# Patient Record
Sex: Female | Born: 1995
Health system: Southern US, Community
[De-identification: ages and names within clinical notes are randomized; demographics above are authoritative.]

## PROBLEM LIST (undated history)

## (undated) DIAGNOSIS — Z8614 Personal history of Methicillin resistant Staphylococcus aureus infection: Secondary | ICD-10-CM

---
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
## 2015-02-21 ENCOUNTER — Other Ambulatory Visit (HOSPITAL_COMMUNITY)
Admission: RE | Admit: 2015-02-21 | Discharge: 2015-02-21 | Disposition: A | Discharge status: Home / Self Care | Payer: BC Managed Care – PPO | Source: Ambulatory Visit | Attending: Emergency Medicine | Admitting: Emergency Medicine

## 2015-02-21 ENCOUNTER — Encounter (HOSPITAL_COMMUNITY): Payer: Self-pay | Admitting: Emergency Medicine

## 2015-02-21 ENCOUNTER — Emergency Department (HOSPITAL_COMMUNITY)
Admission: EM | Admit: 2015-02-21 | Discharge: 2015-02-21 | Disposition: A | Discharge status: Home / Self Care | Payer: BC Managed Care – PPO | Source: Home / Self Care | Attending: Emergency Medicine | Admitting: Emergency Medicine

## 2015-02-21 DIAGNOSIS — Z113 Encounter for screening for infections with a predominantly sexual mode of transmission: Secondary | ICD-10-CM

## 2015-02-21 DIAGNOSIS — N760 Acute vaginitis: Secondary | ICD-10-CM

## 2015-02-21 LAB — POCT URINALYSIS DIP (DEVICE)
Bilirubin Urine: NEGATIVE
GLUCOSE, UA: NEGATIVE mg/dL
HGB URINE DIPSTICK: NEGATIVE
KETONES UR: NEGATIVE mg/dL
Leukocytes, UA: NEGATIVE
Nitrite: NEGATIVE
PROTEIN: NEGATIVE mg/dL
SPECIFIC GRAVITY, URINE: 1.025 (ref 1.005–1.030)
Urobilinogen, UA: 0.2 mg/dL (ref 0.0–1.0)
pH: 6 (ref 5.0–8.0)

## 2015-02-21 LAB — POCT PREGNANCY, URINE: Preg Test, Ur: NEGATIVE

## 2015-02-21 MED ORDER — METRONIDAZOLE 500 MG PO TABS: 500.0000 mg | ORAL_TABLET | Freq: Two times a day (BID) | ORAL | Status: DC

## 2015-02-21 MED ORDER — FLUCONAZOLE 150 MG PO TABS: 150.0000 mg | ORAL_TABLET | Freq: Once | ORAL | Status: DC

## 2015-02-21 NOTE — ED Provider Notes (Signed)
HPI  SUBJECTIVE:  Morgan Cruz is a 20 y.o. female who presents with  7 days  nonoderous vaginal discharge,  vaginal itching. No urgency, frequency, dysuria, oderous urine, hematuria,  genital blisters No aggravating, alleviating factors. Has not tried anything for this. No fevers, N/V, abd pain, back pain. No recent abx use. Pt sexually active with same female partner who is asxatic.  STD's a slight concern today, and is requesting be tested for everything. Has a history of yeast infections, chlamydia. No history of BV gonorrhea, Trichomonas, PID, ectopic pregnancy. No h/o syphilis, herpes, HIV. No h/o DM. LMP last week. PMD Dr. Julian Reil, OB[facility]N at [facility].   History reviewed. No pertinent past medical history.  History reviewed. No pertinent past surgical history.  History reviewed. No pertinent family history.  Social History  Substance Use Topics  . Smoking status: Never Smoker   . Smokeless tobacco: None  . Alcohol Use: No    No current facility-administered medications for this encounter.  Current outpatient prescriptions:  .  fluconazole (DIFLUCAN) 150 MG tablet, Take 1 tablet (150 mg total) by mouth once. 1 tab po x 1. May repeat in 72 hours if no improvement, Disp: 2 tablet, Rfl: 0 .  metroNIDAZOLE (FLAGYL) 500 MG tablet, Take 1 tablet (500 mg total) by mouth 2 (two) times daily. X 7 days, Disp: 14 tablet, Rfl: 0  No Known Allergies   ROS  As noted in HPI.   Physical Exam  BP 132/84 mmHg  Pulse 65  Temp(Src) 97.8 F (36.6 C) (Oral)  Resp 12  SpO2 100%  LMP 02/14/2015 (Exact Date)  Constitutional: Well developed, well nourished, no acute distress Eyes:  EOMI, conjunctiva normal bilaterally HENT: Normocephalic, atraumatic,mucus membranes moist Respiratory: Normal inspiratory effort Cardiovascular: Normal rate GI: nondistended soft, nontender. No suprapubic tenderness  back: No CVA tenderness GU: External genitalia normal.  Normal vaginal mucosa.  Normal os.  Thin  nonoderous  white vaginal discharge.  Uterus smooth, NT. No CMT. No adnexal tenderness. No adnexal masses.  Chaperone present during exam skin: No rash, skin intact Musculoskeletal: no deformities Neurologic: Alert & oriented x 3, no focal neuro deficits Psychiatric: Speech and behavior appropriate   ED Course   Medications - No data to display  Orders Placed This Encounter  Procedures  . HIV antibody    Standing Status: Standing     Number of Occurrences: 1     Standing Expiration Date:   . RPR    Standing Status: Standing     Number of Occurrences: 1     Standing Expiration Date:   . POCT urinalysis dip (device)    Standing Status: Standing     Number of Occurrences: 1     Standing Expiration Date:   . Pregnancy, urine POC    Standing Status: Standing     Number of Occurrences: 1     Standing Expiration Date:     Results for orders placed or performed during the hospital encounter of 02/21/15 (from the past 24 hour(s))  POCT urinalysis dip (device)     Status: None   Collection Time: 02/21/15  8:06 PM  Result Value Ref Range   Glucose, UA NEGATIVE NEGATIVE mg/dL   Bilirubin Urine NEGATIVE NEGATIVE   Ketones, ur NEGATIVE NEGATIVE mg/dL   Specific Gravity, Urine 1.025 1.005 - 1.030   Hgb urine dipstick NEGATIVE NEGATIVE   pH 6.0 5.0 - 8.0   Protein, ur NEGATIVE NEGATIVE mg/dL   Urobilinogen, UA 0.2 0.0 -  1.0 mg/dL   Nitrite NEGATIVE NEGATIVE   Leukocytes, UA NEGATIVE NEGATIVE  Pregnancy, urine POC     Status: None   Collection Time: 02/21/15  8:22 PM  Result Value Ref Range   Preg Test, Ur NEGATIVE NEGATIVE   No results found.  ED Clinical Impression Vaginitis  Screening for STD (sexually transmitted disease)   ED Assessment/Plan  Patient not pregnant. No UTI. H&P most c/w BV vs yeast infection. We'll treat empirically for BV first with Flagyl 500 mg by mouth twice a day for 7 days, if she is not improving in 72 hours, she is to start the Diflucan.  Patient wishes to have lab results prior to being treated. Sent off GC/chlamydia, wet prep, HIV, RPR. Will not treat empirically now.  Advised pt to refrain from sexual contact until she knows lab results, symptoms resolve, and partner(s) are treated if necessary. Pt provided working phone number. Follow-up with Dr. Julian Reil OB/GYN at [facility] as needed.   Discussed labs, MDM, plan and followup with patient. Discussed sn/sx that should prompt return to the ED. Patient agrees with plan.   *This clinic note was created using Dragon dictation software. Therefore, there may be occasional mistakes despite careful proofreading.  ?     Domenick Gong, MD 02/21/15 2031

## 2015-02-21 NOTE — Discharge Instructions (Signed)
Take the medication as written. Give us a working phone number so that we can contact you if needed. Refrain from sexual contact until you know your results and your partner(s) are treated. Return if you get worse, have a fever >100.4, or for any concerns.  ° °Go to www.goodrx.com to look up your medications. This will give you a list of where you can find your prescriptions at the most affordable prices.  °

## 2015-02-21 NOTE — ED Notes (Signed)
The patient presented to the [facility] with a complaint of a vaginal discharge and itching x 1 week.

## 2015-02-22 LAB — HIV ANTIBODY (ROUTINE TESTING W REFLEX): HIV Screen 4th Generation wRfx: NONREACTIVE

## 2015-02-22 LAB — CERVICOVAGINAL ANCILLARY ONLY
Chlamydia: NEGATIVE
Neisseria Gonorrhea: NEGATIVE

## 2015-02-22 LAB — RPR: RPR Ser Ql: NONREACTIVE

## 2015-02-23 LAB — CERVICOVAGINAL ANCILLARY ONLY: Wet Prep (BD Affirm): POSITIVE — AB

## 2015-02-25 ENCOUNTER — Telehealth (HOSPITAL_COMMUNITY): Payer: Self-pay | Admitting: Emergency Medicine

## 2015-02-25 NOTE — ED Notes (Signed)
Called pt and notified her of recent lab results from visit 02/25/15 Pt ID'd properly... Reports she's feeling better and still taking Flagyl Adv pt to finish complete course of Flagyl since she did come back pos for BV  Per Dr. Dayton Scrape,  Pt received rx for metronidazole at [facility] visit 02/21/15. No further tx required unless symptoms persist. LM Please let patient know that gonorrhea/chlamydia/HIV/syphilis tests were negative    Pt given education on safe sex and proper hgyiene Adv pt if sx are not getting better to return  Pt verb understanding.

## 2015-10-22 ENCOUNTER — Encounter (HOSPITAL_COMMUNITY): Payer: Self-pay

## 2015-10-22 ENCOUNTER — Emergency Department (HOSPITAL_COMMUNITY)
Admission: EM | Admit: 2015-10-22 | Discharge: 2015-10-22 | Disposition: A | Discharge status: Home / Self Care | Payer: BC Managed Care – PPO | Attending: Emergency Medicine | Admitting: Emergency Medicine

## 2015-10-22 DIAGNOSIS — K0889 Other specified disorders of teeth and supporting structures: Secondary | ICD-10-CM | POA: Diagnosis not present

## 2015-10-22 MED ORDER — CLINDAMYCIN HCL 150 MG PO CAPS: 300.0000 mg | ORAL_CAPSULE | Freq: Three times a day (TID) | ORAL | 0 refills | Status: DC

## 2015-10-22 NOTE — ED Provider Notes (Signed)
[facility]-EMERGENCY DEPT Provider Note   CSN: [id number] Arrival date & time: 10/22/15  2038  By signing my name below, I, Christy SartoriusAnastasia Kolousek, attest that this documentation has been prepared under the direction and in the presence of  Roxy Horsemanobert Taygen Newsome, PA-C. Electronically Signed: Christy SartoriusAnastasia Kolousek, ED Scribe. 10/22/15. 10:19 PM.  History   Chief Complaint Chief Complaint  Patient presents with  . Dental Pain   The history is provided by the patient and medical records. No language interpreter was used.    HPI Comments:  Morgan Cruz is a 20 y.o. female who presents to the Emergency Department complaining of right sided dental pain beginning 2 days ago.  She reports she has a cavity in her right molar and that her wisdom teeth are coming in.  Her pain makes it difficult to eat and sleep.  She has an appointment with her dentist on Monday.  She has tried aleve, ibuprofen and Orajel without relief.  She denies fever and difficulty swallowing.  Pt is penicillin resistant.   History reviewed. No pertinent past medical history.  There are no active problems to display for this patient.   History reviewed. No pertinent surgical history.  OB History    No data available       Home Medications    Prior to Admission medications   Medication Sig Start Date End Date Taking? Authorizing Provider  fluconazole (DIFLUCAN) 150 MG tablet Take 1 tablet (150 mg total) by mouth once. 1 tab po x 1. May repeat in 72 hours if no improvement 02/21/15   Domenick GongAshley Mortenson, MD  metroNIDAZOLE (FLAGYL) 500 MG tablet Take 1 tablet (500 mg total) by mouth 2 (two) times daily. X 7 days 02/21/15   Domenick GongAshley Mortenson, MD    Family History No family history on file.  Social History Social History  Substance Use Topics  . Smoking status: Never Smoker  . Smokeless tobacco: Never Used  . Alcohol use No     Allergies   Review of patient's allergies indicates no known allergies.   Review of Systems Review  of Systems  Constitutional: Negative for chills and fever.  HENT: Positive for dental problem. Negative for drooling and trouble swallowing.   Neurological: Negative for speech difficulty.  Psychiatric/Behavioral: Positive for sleep disturbance.     Physical Exam Updated Vital Signs BP (!) 144/113   Pulse 65   Temp 98.3 F (36.8 C) (Oral)   Resp 18   Ht 5\' 7"  (1.702 m)   Wt 159 lb (72.1 kg)   SpO2 100%   BMI 24.90 kg/m   Physical Exam Physical Exam  Constitutional: Pt appears well-developed and well-nourished.  HENT:  Head: Normocephalic.  Right Ear: Tympanic membrane, external ear and ear canal normal.  Left Ear: Tympanic membrane, external ear and ear canal normal.  Nose: Nose normal. Right sinus exhibits no maxillary sinus tenderness and no frontal sinus tenderness. Left sinus exhibits no maxillary sinus tenderness and no frontal sinus tenderness.  Mouth/Throat: Uvula is midline, oropharynx is clear and moist and mucous membranes are normal. No oral lesions. No uvula swelling or lacerations. No oropharyngeal exudate, posterior oropharyngeal edema, posterior oropharyngeal erythema or tonsillar abscesses.  Poor dentition No gingival swelling, fluctuance or induration No gross abscess  No sublingual edema, tenderness to palpation, or sign of Ludwig's angina, or deep space infection Pain at right lower rear molar Eyes: Conjunctivae are normal. Pupils are equal, round, and reactive to light. Right eye exhibits no discharge. Left eye exhibits no  discharge.  Neck: Normal range of motion. Neck supple.  No stridor Handling secretions without difficulty No nuchal rigidity No cervical lymphadenopathy Cardiovascular: Normal rate, regular rhythm and normal heart sounds.   Pulmonary/Chest: Effort normal. No respiratory distress.  Equal chest rise  Abdominal: Soft. Bowel sounds are normal. Pt exhibits no distension. There is no tenderness.  Lymphadenopathy: Pt has no cervical  adenopathy.  Neurological: Pt is alert and oriented x 4  Skin: Skin is warm and dry.  Psychiatric: Pt has a normal mood and affect.  Nursing note and vitals reviewed.    ED Treatments / Results   DIAGNOSTIC STUDIES:  Oxygen Saturation is 100% on RA, NML by my interpretation.    COORDINATION OF CARE:  10:19 PM Discussed treatment plan with pt at bedside and pt agreed to plan.  Labs (all labs ordered are listed, but only abnormal results are displayed) Labs Reviewed - No data to display  EKG  EKG Interpretation None       Radiology No results found.  Procedures Dental Block Date/Time: 10/23/2015 12:14 AM Performed by: Roxy Horseman Authorized by: Roxy Horseman   Consent:    Consent obtained:  Verbal   Consent given by:  Patient   Risks discussed:  Allergic reaction, pain, swelling and unsuccessful block   Alternatives discussed:  No treatment Indications:    Indications: dental pain   Location:    Block type:  Inferior alveolar   Laterality:  Right Procedure details (see MAR for exact dosages):    Syringe type:  Luer lock syringe   Needle gauge:  27 G   Anesthetic injected:  Bupivacaine 0.25% WITH epi   Injection procedure:  Anatomic landmarks identified, introduced needle, incremental injection and anatomic landmarks palpated Post-procedure details:    Outcome:  Pain improved   (including critical care time)  Medications Ordered in ED Medications - No data to display   Initial Impression / Assessment and Plan / ED Course  I have reviewed the triage vital signs and the nursing notes.  Pertinent labs & imaging results that were available during my care of the patient were reviewed by me and considered in my medical decision making (see chart for details).  Clinical Course     Patient with dentalgia.  No abscess requiring immediate incision and drainage.  Exam not concerning for Ludwig's angina or pharyngeal abscess.  Will treat with clinda. Pt  instructed to follow-up with dentist.  Discussed return precautions. Pt safe for discharge.  Final Clinical Impressions(s) / ED Diagnoses   Final diagnoses:  Pain, dental    New Prescriptions Discharge Medication List as of 10/22/2015 10:34 PM    START taking these medications   Details  clindamycin (CLEOCIN) 150 MG capsule Take 2 capsules (300 mg total) by mouth 3 (three) times daily. May dispense as 150mg  capsules, Starting Sat 10/22/2015, Print       I personally performed the services described in this documentation, which was scribed in my presence. The recorded information has been reviewed and is accurate.       Roxy Horseman, PA-C 10/23/15 0016    Lavera Guise, MD 10/23/15 0030

## 2015-10-22 NOTE — ED Notes (Signed)
Patient verbalized understanding of discharge instructions and denies any further needs or questions at this time. VS stable. Patient ambulatory with steady gait. Declined wheelchair, RN walked with pt to ED entrance.

## 2015-10-22 NOTE — ED Triage Notes (Signed)
Pt states that he wisdom teeth on the R side are coming in and the pain is unbearable and unrelieved by OTC medications. Pt has a dentist appt on Monday.

## 2015-10-22 NOTE — ED Notes (Signed)
PA-C to see and assess patient before RN assessment. Patient currently up for discharge. See PA note.

## 2016-02-14 ENCOUNTER — Ambulatory Visit (INDEPENDENT_AMBULATORY_CARE_PROVIDER_SITE_OTHER): Payer: BC Managed Care – PPO | Admitting: Physician Assistant

## 2016-02-14 VITALS — BP 120/82 | HR 81 | Temp 98.3°F | Resp 18 | Ht 67.0 in | Wt 160.0 lb

## 2016-02-14 DIAGNOSIS — R6889 Other general symptoms and signs: Secondary | ICD-10-CM

## 2016-02-14 MED ORDER — GUAIFENESIN ER 1200 MG PO TB12: 1.0000 | ORAL_TABLET | Freq: Two times a day (BID) | ORAL | 0 refills | Status: AC

## 2016-02-14 MED ORDER — HYDROCOD POLST-CPM POLST ER 10-8 MG/5ML PO SUER
5.0000 mL | Freq: Two times a day (BID) | ORAL | 0 refills | Status: DC | PRN
Start: 2016-02-14 — End: 2016-04-11

## 2016-02-14 NOTE — Patient Instructions (Addendum)
  Please push fluids.  Tylenol and Motrin for fever and body aches.    A humidifier can help especially when the air is dry -if you do not have a humidifier you can boil a pot of water on the stove in your home to help with the dry air.  Nasal saline spray can be helpful to keep the mucus membranes moist and thin the nasal mucus    IF you received an x-ray today, you will receive an invoice from [facility]. Please contact [facility] at [phone] with questions or concerns regarding your invoice.   IF you received labwork today, you will receive an invoice from [facility]. Please contact [facility] at [phone] with questions or concerns regarding your invoice.   Our billing staff will not be able to assist you with questions regarding bills from these companies.  You will be contacted with the lab results as soon as they are available. The fastest way to get your results is to activate your My Chart account. Instructions are located on the last page of this paperwork. If you have not heard from us regarding the results in 2 weeks, please contact this office.      

## 2016-02-14 NOTE — Progress Notes (Signed)
   Elgie CollardJada Moncus  MRN: [id number] DOB: Jul 29, 1995  Subjective:  Pt presents to clinic with cold symptoms for the last 4 days.  Started with back pain and fever with chills.  She has had headaches and myalgias.  She has had a fever that she thought was getting better but then came back last night.  She has a cough with yellow sputum and her rhinorrhea is also yellow.  She is not sleeping great because of the cough.  She started humidifer last night which helped her sleep.  No flu vaccine -   Review of Systems  Constitutional: Positive for chills and fever (Tmax 101).  HENT: Positive for congestion, postnasal drip, rhinorrhea (yellow) and sore throat.   Respiratory: Positive for cough (yellow).        No h/o asthma  Gastrointestinal: Positive for diarrhea (resolved). Negative for nausea.  Musculoskeletal: Positive for myalgias.  Neurological: Positive for headaches.    There are no active problems to display for this patient.   No current outpatient prescriptions on file prior to visit.   No current facility-administered medications on file prior to visit.     Allergies  Allergen Reactions  . Peanut Oil Itching  . Lac Bovis Rash    Pt patients past, family and social history were reviewed and updated.   Objective:  BP 120/82 (BP Location: Right Arm, Patient Position: Sitting, Cuff Size: Small)   Pulse 81   Temp 98.3 F (36.8 C) (Oral)   Resp 18   Ht 5\' 7"  (1.702 m)   Wt 160 lb (72.6 kg)   LMP 01/24/2016 (Approximate)   SpO2 98%   BMI 25.06 kg/m   Physical Exam  Constitutional: She is oriented to person, place, and time and well-developed, well-nourished, and in no distress.  HENT:  Head: Normocephalic and atraumatic.  Right Ear: Hearing, tympanic membrane, external ear and ear canal normal.  Left Ear: Hearing, tympanic membrane, external ear and ear canal normal.  Nose: Nose normal.  Mouth/Throat: Uvula is midline, oropharynx is clear and moist and mucous membranes  are normal.  Eyes: Conjunctivae are normal.  Neck: Normal range of motion.  Cardiovascular: Normal rate, regular rhythm and normal heart sounds.   No murmur heard. Pulmonary/Chest: Effort normal and breath sounds normal.  Neurological: She is alert and oriented to person, place, and time. Gait normal.  Skin: Skin is warm and dry.  Psychiatric: Mood, memory, affect and judgment normal.  Vitals reviewed.   Assessment and Plan :  Flu-like symptoms - Plan: chlorpheniramine-HYDROcodone (TUSSIONEX PENNKINETIC ER) 10-8 MG/5ML SUER, Guaifenesin (MUCINEX MAXIMUM STRENGTH) 1200 MG TB12, Care order/instruction:   Symptomatic treatment d/w pt - rest and out od work and school as instructed  Benny LennertSarah Thalya Fouche PA-C  Primary Care at [facility] 02/14/2016 10:27 AM

## 2016-04-11 ENCOUNTER — Ambulatory Visit (INDEPENDENT_AMBULATORY_CARE_PROVIDER_SITE_OTHER): Payer: BC Managed Care – PPO | Admitting: Family Medicine

## 2016-04-11 VITALS — BP 122/72 | HR 70 | Temp 98.9°F | Resp 17 | Ht 68.0 in | Wt 161.0 lb

## 2016-04-11 DIAGNOSIS — J069 Acute upper respiratory infection, unspecified: Secondary | ICD-10-CM

## 2016-04-11 NOTE — Progress Notes (Signed)
   Morgan Cruz is a 21 y.o. female who [facility] today for:  1. URI. States that she has been feeling sick since this past weekend. She is endorsing symptoms of scratchy sore throat, bad cough, and congestion. She states that about a month ago she was diagnosed with the flu. However she was not treated due to being past timeframe. States that symptoms improved until this past weekend. Patient's states that she had a fever 2 days ago to 101F. Nothing really bothering her other than the congestion and cough. No known sick contacts. Has tried taking Tylenol PM and cough drops. States that this helps some. Denies any myalgias, headaches, nausea, vomiting, diarrhea.   ROS as above.  Pertinently, no chest pain, palpitations, SOB, Fever, Chills, Abd pain.   PMH reviewed. Patient is a nonsmoker.   No past medical history on file. No past surgical history on file.  Medications reviewed. Current Outpatient Prescriptions  Medication Sig Dispense Refill  . Norethin Ace-Eth Estrad-FE (MIBELAS 24 FE) 1-20 MG-MCG(24) CHEW      No current facility-administered medications for this visit.      Physical Exam:  BP 122/72   Pulse 70   Temp 98.9 F (37.2 C) (Oral)   Resp 17   Ht 5\' 8"  (1.727 m)   Wt 161 lb (73 kg)   LMP 03/14/2016 (Approximate)   SpO2 97%   BMI 24.48 kg/m  Gen:  Alert, cooperative patient who appears stated age in no acute distress.  Vital signs reviewed. HEENT: EOMI,  MMM, o/p clear. Ear exam unremarkable with normal bilateral TMs. Neck supple and without adenopathy.  Pulm:  Clear to auscultation bilaterally with good air movement.  No wheezes or rales noted.   Cardiac:  Regular rate and rhythm without murmur auscultated.  Good S1/S2. Abd:  Soft/nondistended/nontender.  Good bowel sounds throughout all four quadrants.  No masses noted.  Exts: Non edematous BL  LE, warm and well perfused.   Assessment and Plan:  1. Acute upper respiratory infection Most  likely has viral URI. Patient is well-appearing vitals are stable. She is afebrile. Had flu last month which puts patient at increased risk for subsequent infections. No concern for pneumonia at this time as lung exam unremarkable. Conservative measures. Patient can use over-the-counter cold congestion medications. Handout given. Return precautions discussed.   Morgan AdaJazma Edgard Debord, DO 04/11/2016, 1:57 PM PGY-3,  Family Medicine

## 2016-04-11 NOTE — Patient Instructions (Addendum)
Upper Respiratory Infection, Adult Most upper respiratory infections (URIs) are caused by a virus. A URI affects the nose, throat, and upper air passages. The most common type of URI is often called "the common cold." Follow these instructions at home:  Take medicines only as told by your doctor.  Gargle warm saltwater or take cough drops to comfort your throat as told by your doctor.  Use a warm mist humidifier or inhale steam from a shower to increase air moisture. This may make it easier to breathe.  Drink enough fluid to keep your pee (urine) clear or pale yellow.  Eat soups and other clear broths.  Have a healthy diet.  Rest as needed.  Go back to work when your fever is gone or your doctor says it is okay.  You may need to stay home longer to avoid giving your URI to others.  You can also wear a face mask and wash your hands often to prevent spread of the virus.  Use your inhaler more if you have asthma.  Do not use any tobacco products, including cigarettes, chewing tobacco, or electronic cigarettes. If you need help quitting, ask your doctor. Contact a doctor if:  You are getting worse, not better.  Your symptoms are not helped by medicine.  You have chills.  You are getting more short of breath.  You have brown or red mucus.  You have yellow or brown discharge from your nose.  You have pain in your face, especially when you bend forward.  You have a fever.  You have puffy (swollen) neck glands.  You have pain while swallowing.  You have white areas in the back of your throat. Get help right away if:  You have very bad or constant:  Headache.  Ear pain.  Pain in your forehead, behind your eyes, and over your cheekbones (sinus pain).  Chest pain.  You have long-lasting (chronic) lung disease and any of the following:  Wheezing.  Long-lasting cough.  Coughing up blood.  A change in your usual mucus.  You have a stiff neck.  You have  changes in your:  Vision.  Hearing.  Thinking.  Mood. This information is not intended to replace advice given to you by your health care provider. Make sure you discuss any questions you have with your health care provider. Document Released: 07/04/2007 Document Revised: 09/18/2015 Document Reviewed: 04/22/2013 Elsevier Interactive Patient Education  2017 Elsevier Inc.     IF you received an x-ray today, you will receive an invoice from [facility]. Please contact [facility] at [phone] with questions or concerns regarding your invoice.   IF you received labwork today, you will receive an invoice from [facility]. Please contact [facility] at [phone] with questions or concerns regarding your invoice.   Our billing staff will not be able to assist you with questions regarding bills from these companies.  You will be contacted with the lab results as soon as they are available. The fastest way to get your results is to activate your My Chart account. Instructions are located on the last page of this paperwork. If you have not heard from us regarding the results in 2 weeks, please contact this office.      

## 2016-07-06 ENCOUNTER — Ambulatory Visit (INDEPENDENT_AMBULATORY_CARE_PROVIDER_SITE_OTHER): Payer: BC Managed Care – PPO | Admitting: Physician Assistant

## 2016-07-06 ENCOUNTER — Encounter: Payer: Self-pay | Admitting: Physician Assistant

## 2016-07-06 VITALS — BP 128/83 | HR 112 | Temp 99.3°F | Resp 17 | Ht 68.5 in | Wt 167.0 lb

## 2016-07-06 DIAGNOSIS — M62838 Other muscle spasm: Secondary | ICD-10-CM

## 2016-07-06 DIAGNOSIS — S39012A Strain of muscle, fascia and tendon of lower back, initial encounter: Secondary | ICD-10-CM | POA: Diagnosis not present

## 2016-07-06 MED ORDER — CYCLOBENZAPRINE HCL 5 MG PO TABS: 5.0000 mg | ORAL_TABLET | Freq: Three times a day (TID) | ORAL | 0 refills | Status: DC | PRN

## 2016-07-06 MED ORDER — IBUPROFEN 800 MG PO TABS: 800.0000 mg | ORAL_TABLET | Freq: Three times a day (TID) | ORAL | 0 refills | Status: DC | PRN

## 2016-07-06 NOTE — Progress Notes (Signed)
   Elgie CollardJada Gan  MRN: [id number] DOB: 06/04/1995  PCP: System, Pcp Not In  Chief Complaint  Patient presents with  . Back Pain    Subjective:  Pt presents to clinic for 5 day h/o muscle spasms in her back.  She used ice and icy hot and used motrin but the pain is not getting better.  She feels like it is getting worse.  Sitting, twisitng  is uncomfortable and she cannot lay on her back due to the pain.  No pain radiation no paresthesias or numbness.  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  The pain is on the left side - the pain is tight and restricting.  No bowel or bladder problems.  She has had this before but it typically goes away in a few days but this has not.  Review of Systems  Musculoskeletal: Positive for back pain. Negative for gait problem.    There are no active problems to display for this patient.   Current Outpatient Prescriptions on File Prior to Visit  Medication Sig Dispense Refill  . Norethin Ace-Eth Estrad-FE (MIBELAS 24 FE) 1-20 MG-MCG(24) CHEW      No current facility-administered medications on file prior to visit.     Allergies  Allergen Reactions  . Peanut Oil Itching  . Lac Bovis Rash    Pt patients past, family and social history were reviewed and updated.   Objective:  BP 128/83   Pulse (!) 112   Temp 99.3 F (37.4 C) (Oral)   Resp 17   Ht 5' 8.5" (1.74 m)   Wt 167 lb (75.8 kg)   LMP 06/22/2016 (Approximate)   SpO2 98%   BMI 25.02 kg/m   Physical Exam  Constitutional: She is oriented to person, place, and time and well-developed, well-nourished, and in no distress.  HENT:  Head: Normocephalic and atraumatic.  Right Ear: Hearing and external ear normal.  Left Ear: Hearing and external ear normal.  Eyes: Conjunctivae are normal.  Neck: Normal range of motion.  Pulmonary/Chest: Effort normal.  Musculoskeletal:       Lumbar back: She exhibits decreased range of motion (slight decrease due to  pain), tenderness and spasm.       Back:  Neurological: She is alert and oriented to person, place, and time. She has normal sensation, normal strength and normal reflexes. She displays no weakness and normal reflexes. She has a normal Straight Leg Raise Test. Gait normal. Gait normal.  Skin: Skin is warm and dry.  Psychiatric: Mood, memory, affect and judgment normal.  Vitals reviewed.   Assessment and Plan :  Strain of lumbar region, initial encounter - Plan: ibuprofen (ADVIL,MOTRIN) 800 MG tablet, cyclobenzaprine (FLEXERIL) 5 MG tablet, Care order/instruction:  Muscle spasm   Heat and stretches - d/w pt that good core strength will decrease this from occurring so much - f/u in a week if not better - warnings signs were given as to when to rtc sooner  Virgilio BellingSarah Weber PA-C  Primary Care at [facility] 07/09/2016 8:06 AM

## 2016-07-06 NOTE — Patient Instructions (Addendum)
   IF you received an x-ray today, you will receive an invoice from [facility]. Please contact  Radiology at [phone] with questions or concerns regarding your invoice.   IF you received labwork today, you will receive an invoice from [facility]. Please contact [facility] at [phone] with questions or concerns regarding your invoice.   Our billing staff will not be able to assist you with questions regarding bills from these companies.  You will be contacted with the lab results as soon as they are available. The fastest way to get your results is to activate your My Chart account. Instructions are located on the last page of this paperwork. If you have not heard from us regarding the results in 2 weeks, please contact this office.     Low Back Sprain A sprain is a stretch or tear in the bands of tissue that hold bones and joints together (ligaments). Sprains of the lower back (lumbar spine) are a common cause of low back pain. A sprain occurs when ligaments are overextended or stretched beyond their limits. The ligaments can become inflamed, resulting in pain and sudden muscle tightening (spasms). A sprain can be caused by an injury (trauma), or it can develop gradually due to overuse. There are three types of sprains:  Grade 1 is a mild sprain involving an overstretched ligament or a very slight tear of the ligament.  Grade 2 is a moderate sprain involving a partial tear of the ligament.  Grade 3 is a severe sprain involving a complete tear of the ligament.  What are the causes? This condition may be caused by:  Trauma, such as a fall or a hit to the body.  Twisting or overstretching the back. This may result from doing activities that require a lot of energy, such as lifting heavy objects.  What increases the risk? The following factors may increase your risk of getting this condition:  Playing contact sports.  Participating in sports or activities that  put excessive stress on the back and require a lot of bending and twisting, including: ? Lifting weights or heavy objects. ? Gymnastics. ? Soccer. ? Figure skating. ? Snowboarding.  Being overweight or obese.  Having poor strength and flexibility.  What are the signs or symptoms? Symptoms of this condition may include:  Sharp or dull pain in the lower back that does not go away. Pain may extend to the buttocks.  Stiffness.  Limited range of motion.  Inability to stand up straight due to stiffness or pain.  Muscle spasms.  How is this diagnosed?  This condition may be diagnosed based on:  Your symptoms.  Your medical history.  A physical exam. ? Your health care provider may push on certain areas of your back to determine the source of your pain. ? You may be asked to bend forward, backward, and side to side to assess the severity of your pain and your range of motion.  Imaging tests, such as: ? X-rays. ? MRI.  How is this treated? Treatment for this condition may include:  Applying heat and cold to the affected area.  Medicines to help relieve pain and to relax your muscles (muscle relaxants).  NSAIDs to help reduce swelling and discomfort.  Physical therapy.  When your symptoms improve, it is important to gradually return to your normal routine as soon as possible to reduce pain, avoid stiffness, and avoid loss of muscle strength. Generally, symptoms should improve within 6 weeks of treatment. However, recovery time   varies. Follow these instructions at home: Managing pain, stiffness, and swelling  If directed, apply ice to the injured area during the first 24 hours after your injury. ? Put ice in a plastic bag. ? Place a towel between your skin and the bag. ? Leave the ice on for 20 minutes, 2-3 times a day.  If directed, apply heat to the affected area as often as told by your health care provider. Use the heat source that your health care provider  recommends, such as a moist heat pack or a heating pad. ? Place a towel between your skin and the heat source. ? Leave the heat on for 20-30 minutes. ? Remove the heat if your skin turns bright red. This is especially important if you are unable to feel pain, heat, or cold. You may have a greater risk of getting burned. Activity  Rest and return to your normal activities as told by your health care provider. Ask your health care provider what activities are safe for you.  Avoid activities that take a lot of effort (are strenuous) for as long as told by your health care provider.  Do exercises as told by your health care provider. General instructions   Take over-the-counter and prescription medicines only as told by your health care provider.  If you have questions or concerns about safety while taking pain medicine, talk with your health care provider.  Do not drive or operate heavy machinery until you know how your pain medicine affects you.  Do not use any tobacco products, such as cigarettes, chewing tobacco, and e-cigarettes. Tobacco can delay bone healing. If you need help quitting, ask your health care provider.  Keep all follow-up visits as told by your health care provider. This is important. How is this prevented?  Warm up and stretch before being active.  Cool down and stretch after being active.  Give your body time to rest between periods of activity.  Avoid: ? Being physically inactive for long periods at a time. ? Exercising or playing sports when you are tired or in pain.  Use correct form when playing sports and lifting heavy objects.  Use good posture when sitting and standing.  Maintain a healthy weight.  Sleep on a mattress with medium firmness to support your back.  Make sure to use equipment that fits you, including shoes that fit well.  Be safe and responsible while being active to avoid falls.  Do at least 150 minutes of moderate-intensity  exercise each week, such as brisk walking or water aerobics. Try a form of exercise that takes stress off your back, such as swimming or stationary cycling.  Maintain physical fitness, including: ? Strength. In particular, develop and maintain strong abdominal muscles. ? Flexibility. ? Cardiovascular fitness. ? Endurance. Contact a health care provider if:  Your back pain does not improve after 6 weeks of treatment.  Your symptoms get worse. Get help right away if:  Your back pain is severe.  You are unable to stand or walk.  You develop pain in your legs.  You develop weakness in your buttocks or legs.  You have difficulty controlling when you urinate or when you have a bowel movement. This information is not intended to replace advice given to you by your health care provider. Make sure you discuss any questions you have with your health care provider. Document Released: 01/15/2005 Document Revised: 09/22/2015 Document Reviewed: 10/27/2014 Elsevier Interactive Patient Education  2018 [employer]  

## 2016-10-31 ENCOUNTER — Encounter (HOSPITAL_COMMUNITY): Payer: Self-pay | Admitting: Emergency Medicine

## 2016-10-31 ENCOUNTER — Ambulatory Visit (HOSPITAL_COMMUNITY)
Admission: EM | Admit: 2016-10-31 | Discharge: 2016-10-31 | Disposition: A | Discharge status: Home / Self Care | Payer: BC Managed Care – PPO | Attending: Family Medicine | Admitting: Family Medicine

## 2016-10-31 DIAGNOSIS — L02415 Cutaneous abscess of right lower limb: Secondary | ICD-10-CM | POA: Diagnosis not present

## 2016-10-31 DIAGNOSIS — L0291 Cutaneous abscess, unspecified: Secondary | ICD-10-CM

## 2016-10-31 HISTORY — DX: Personal history of Methicillin resistant Staphylococcus aureus infection: Z86.14

## 2016-10-31 MED ORDER — MUPIROCIN 2 % EX OINT: 1.0000 "application " | TOPICAL_OINTMENT | Freq: Two times a day (BID) | CUTANEOUS | 0 refills | Status: AC

## 2016-10-31 MED ORDER — DOXYCYCLINE HYCLATE 100 MG PO CAPS: 100.0000 mg | ORAL_CAPSULE | Freq: Two times a day (BID) | ORAL | 0 refills | Status: AC

## 2016-10-31 NOTE — ED Provider Notes (Signed)
[facility]    CSN: [id number] Arrival date & time: 10/31/16  1842     History   Chief Complaint Chief Complaint  Patient presents with  . Abscess    HPI Morgan Cruz is a 21 y.o. female.   21 year old female comes in for 1 week history of abscess of the right inner thigh. She states it started out as a pimple, and then increased in size. The abscess self drained and she has been cleaning with peroxide and applying neosporin to dress the wound. She states pain has improved since abscess self drained. Would like to get evaluated due to history of MRSA infection. Patient states she was told she is PCN and amoxicillin resistant. Denies fever, chills, night sweats. Denies spreading erythema, increased warmth, increased pain.       Past Medical History:  Diagnosis Date  . History of methicillin resistant staphylococcus aureus (MRSA)     There are no active problems to display for this patient.   History reviewed. No pertinent surgical history.  OB History    No data available       Home Medications    Prior to Admission medications   Medication Sig Start Date End Date Taking? Authorizing Provider  Norethin Ace-Eth Estrad-FE (MIBELAS 24 FE) 1-20 MG-MCG(24) CHEW  08/08/15  Yes [provider]  doxycycline (VIBRAMYCIN) 100 MG capsule Take 1 capsule (100 mg total) by mouth 2 (two) times daily. 10/31/16 11/07/16  Belinda Fisher, PA-C  mupirocin ointment (BACTROBAN) 2 % Apply 1 application topically 2 (two) times daily. 10/31/16   Belinda Fisher, PA-C    Family History History reviewed. No pertinent family history.  Social History Social History  Substance Use Topics  . Smoking status: Never Smoker  . Smokeless tobacco: Never Used  . Alcohol use No     Allergies   Peanut oil and Lac bovis   Review of Systems Review of Systems  Reason unable to perform ROS: See HPI as above.     Physical Exam Triage Vital Signs ED Triage Vitals  Enc Vitals Group     BP 10/31/16 1858 136/85     Pulse Rate 10/31/16 1858 74     Resp 10/31/16 1858 16     Temp 10/31/16 1858 98.7 F (37.1 C)     Temp Source 10/31/16 1858 Oral     SpO2 10/31/16 1858 100 %     Weight --      Height --      Head Circumference --      Peak Flow --      Pain Score 10/31/16 1856 3     Pain Loc --      Pain Edu? --      Excl. in GC? --    No data found.   Updated Vital Signs BP 136/85 (BP Location: Left Arm)   Pulse 74   Temp 98.7 F (37.1 C) (Oral)   Resp 16   LMP 10/03/2016   SpO2 100%   Physical Exam  Constitutional: She is oriented to person, place, and time. She appears well-developed and well-nourished. No distress.  HENT:  Head: Normocephalic and atraumatic.  Eyes: Pupils are equal, round, and reactive to light. Conjunctivae are normal.  Neurological: She is alert and oriented to person, place, and time.  Skin:  About 1cm x 0.5 cm opening with 2cm surrounding erythema at upper right inner thigh adjacent to the groin area. No obvious drainage seen. Tenderness  to palpation around opening. Mild increased warmth. Abscess pocket about 0.5cm deep measured using swab.      UC Treatments / Results  Labs (all labs ordered are listed, but only abnormal results are displayed) Labs Reviewed  AEROBIC CULTURE (SUPERFICIAL SPECIMEN)    EKG  EKG Interpretation None       Radiology No results found.  Procedures Procedures (including critical care time)  Medications Ordered in UC Medications - No data to display   Initial Impression / Assessment and Plan / UC Course  I have reviewed the triage vital signs and the nursing notes.  Pertinent labs & imaging results that were available during my care of the patient were reviewed by me and considered in my medical decision making (see chart for details).    Wound swabbed for culture. Wound cleaned and dressed. Start doxycycline as directed. Bactroban on area. Daily dressing, wound care instructions  given. Return precautions given.   Final Clinical Impressions(s) / UC Diagnoses   Final diagnoses:  Abscess    New Prescriptions Discharge Medication List as of 10/31/2016  7:29 PM    START taking these medications   Details  doxycycline (VIBRAMYCIN) 100 MG capsule Take 1 capsule (100 mg total) by mouth 2 (two) times daily., Starting Wed 10/31/2016, Until Wed 11/07/2016, Normal    mupirocin ointment (BACTROBAN) 2 % Apply 1 application topically 2 (two) times daily., Starting Wed 10/31/2016, Normal          Linward Headland V, PA-C 10/31/16 1941

## 2016-10-31 NOTE — Discharge Instructions (Signed)
Your abscess has self drained. Start doxycycline as directed. Keep area clean and dry. You can apply bactroban on affected area. Daily dressing. Wound culture sent, it can take up to 3-5 days for results to come back. You will be contacted with any positive results, additional treatment needed will be called in then. Monitor for any worsening of symptoms, spreading redness, increased warmth, fever, follow up for reevaluation.

## 2016-10-31 NOTE — ED Triage Notes (Signed)
The patient presented to the [facility] with a complaint of a possible abscess on the inside of her right leg x 1 week. The patient reported a previous HX of MRSA.

## 2016-10-31 NOTE — ED Notes (Signed)
Patient discharged by provider.

## 2016-11-03 LAB — AEROBIC CULTURE W GRAM STAIN (SUPERFICIAL SPECIMEN): Special Requests: NORMAL

## 2016-11-03 LAB — AEROBIC CULTURE  (SUPERFICIAL SPECIMEN): GRAM STAIN: NONE SEEN

## 2017-10-25 ENCOUNTER — Telehealth: Payer: BC Managed Care – PPO | Admitting: Nurse Practitioner

## 2017-10-25 DIAGNOSIS — B3731 Acute candidiasis of vulva and vagina: Secondary | ICD-10-CM

## 2017-10-25 DIAGNOSIS — B373 Candidiasis of vulva and vagina: Secondary | ICD-10-CM

## 2017-10-25 MED ORDER — FLUCONAZOLE 150 MG PO TABS: 150.0000 mg | ORAL_TABLET | Freq: Once | ORAL | 0 refills | Status: AC

## 2017-10-25 NOTE — Progress Notes (Signed)
We are sorry that you are not feeling well. Here is how we plan to help! Based on what you shared with me it looks like you: May have a yeast vaginosis  Vaginosis is an inflammation of the vagina that can result in discharge, itching and pain. The cause is usually a change in the normal balance of vaginal bacteria or an infection. Vaginosis can also result from reduced estrogen levels after menopause.  The most common causes of vaginosis are:   Bacterial vaginosis which results from an overgrowth of one on several organisms that are normally present in your vagina.   Yeast infections which are caused by a naturally occurring fungus called candida.   Vaginal atrophy (atrophic vaginosis) which results from the thinning of the vagina from reduced estrogen levels after menopause.   Trichomoniasis which is caused by a parasite and is commonly transmitted by sexual intercourse.  Factors that increase your risk of developing vaginosis include: Marland Kitchen Medications, such as antibiotics and steroids . Uncontrolled diabetes . Use of hygiene products such as bubble bath, vaginal spray or vaginal deodorant . Douching . Wearing damp or tight-fitting clothing . Using an intrauterine device (IUD) for birth control . Hormonal changes, such as those associated with pregnancy, birth control pills or menopause . Sexual activity . Having a sexually transmitted infection  Your treatment plan is A single Diflucan (fluconazole) 150mg  tablet once.  I have electronically sent this prescription into the pharmacy that you have chosen.  Be sure to take all of the medication as directed. Stop taking any medication if you develop a rash, tongue swelling or shortness of breath. Mothers who are breast feeding should consider pumping and discarding their breast milk while on these antibiotics. However, there is no consensus that infant exposure at these doses would be harmful.  Remember that medication creams can weaken latex  condoms. .  * usually yeast does not smell that bad- bacterial vaginosis that is treated with flagyl has a fishy odor. So if does not get better lets [place] know.  HOME CARE:  Good hygiene may prevent some types of vaginosis from recurring and may relieve some symptoms:  . Avoid baths, hot tubs and whirlpool spas. Rinse soap from your outer genital area after a shower, and dry the area well to prevent irritation. Don't use scented or harsh soaps, such as those with deodorant or antibacterial action. Marland Kitchen Avoid irritants. These include scented tampons and pads. . Wipe from front to back after using the toilet. Doing so avoids spreading fecal bacteria to your vagina.  Other things that may help prevent vaginosis include:  Marland Kitchen Don't douche. Your vagina doesn't require cleansing other than normal bathing. Repetitive douching disrupts the normal organisms that reside in the vagina and can actually increase your risk of vaginal infection. Douching won't clear up a vaginal infection. . Use a latex condom. Both female and female latex condoms may help you avoid infections spread by sexual contact. . Wear cotton underwear. Also wear pantyhose with a cotton crotch. If you feel comfortable without it, skip wearing underwear to bed. Yeast thrives in [employer] Your symptoms should improve in the next day or two.  GET HELP RIGHT AWAY IF:  . You have pain in your lower abdomen ( pelvic area or over your ovaries) . You develop nausea or vomiting . You develop a fever . Your discharge changes or worsens . You have persistent pain with intercourse . You develop shortness of breath, a rapid pulse, or  you faint.  These symptoms could be signs of problems or infections that need to be evaluated by a medical provider now.  MAKE SURE YOU    Understand these instructions.  Will watch your condition.  Will get help right away if you are not doing well or get worse.  Your e-visit answers were reviewed by  a board certified advanced clinical practitioner to complete your personal care plan. Depending upon the condition, your plan could have included both over the counter or prescription medications. Please review your pharmacy choice to make sure that you have choses a pharmacy that is open for you to pick up any needed prescription, Your safety is important to [place]. If you have drug allergies check your prescription carefully.   You can use MyChart to ask questions about today's visit, request a non-urgent call back, or ask for a work or school excuse for 24 hours related to this e-Visit. If it has been greater than 24 hours you will need to follow up with your provider, or enter a new e-Visit to address those concerns. You will get a MyChart message within the next two days asking about your experience. I hope that your e-visit has been valuable and will speed your recovery.

## 2017-12-05 ENCOUNTER — Telehealth: Payer: BC Managed Care – PPO | Admitting: Physician Assistant

## 2017-12-05 DIAGNOSIS — B373 Candidiasis of vulva and vagina: Secondary | ICD-10-CM

## 2017-12-05 DIAGNOSIS — B3731 Acute candidiasis of vulva and vagina: Secondary | ICD-10-CM

## 2017-12-05 MED ORDER — FLUCONAZOLE 150 MG PO TABS: 150.0000 mg | ORAL_TABLET | Freq: Once | ORAL | 0 refills | Status: AC

## 2017-12-05 NOTE — Progress Notes (Signed)
We are sorry that you are not feeling well. Here is how we plan to help! Based on what you shared with me it looks like you: May have a yeast vaginosis. If itching is the main symptom and your symptoms comometely cleared with treatment for yeast, it is likely another yeast infection.  Vaginosis is an inflammation of the vagina that can result in discharge, itching and pain. The cause is usually a change in the normal balance of vaginal bacteria or an infection. Vaginosis can also result from reduced estrogen levels after menopause.  The most common causes of vaginosis are:   Bacterial vaginosis which results from an overgrowth of one on several organisms that are normally present in your vagina.   Yeast infections which are caused by a naturally occurring fungus called candida.   Vaginal atrophy (atrophic vaginosis) which results from the thinning of the vagina from reduced estrogen levels after menopause.   Trichomoniasis which is caused by a parasite and is commonly transmitted by sexual intercourse.  Factors that increase your risk of developing vaginosis include: Marland Kitchen Medications, such as antibiotics and steroids . Uncontrolled diabetes . Use of hygiene products such as bubble bath, vaginal spray or vaginal deodorant . Douching . Wearing damp or tight-fitting clothing . Using an intrauterine device (IUD) for birth control . Hormonal changes, such as those associated with pregnancy, birth control pills or menopause . Sexual activity . Having a sexually transmitted infection  Your treatment plan is A single Diflucan (fluconazole) 150mg  tablet once.  I have electronically sent this prescription into the pharmacy that you have chosen. If symptoms are not resolving, or you have a recurrence of symptoms you need to schedule an appointment with your primary provider or a gynecologist for a comprehensive vaginal examination.  Be sure to take all of the medication as directed. Stop taking any  medication if you develop a rash, tongue swelling or shortness of breath. Marland Kitchen   HOME CARE:  Good hygiene may prevent some types of vaginosis from recurring and may relieve some symptoms:  . Avoid baths, hot tubs and whirlpool spas. Rinse soap from your outer genital area after a shower, and dry the area well to prevent irritation. Don't use scented or harsh soaps, such as those with deodorant or antibacterial action. Marland Kitchen Avoid irritants. These include scented tampons and pads. . Wipe from front to back after using the toilet. Doing so avoids spreading fecal bacteria to your vagina.  Other things that may help prevent vaginosis include:  Marland Kitchen Don't douche. Your vagina doesn't require cleansing other than normal bathing. Repetitive douching disrupts the normal organisms that reside in the vagina and can actually increase your risk of vaginal infection. Douching won't clear up a vaginal infection. . Use a latex condom. Both female and female latex condoms may help you avoid infections spread by sexual contact. . Wear cotton underwear. Also wear pantyhose with a cotton crotch. If you feel comfortable without it, skip wearing underwear to bed. Yeast thrives in [employer] Your symptoms should improve in the next day or two.  GET HELP RIGHT AWAY IF:  . You have pain in your lower abdomen ( pelvic area or over your ovaries) . You develop nausea or vomiting . You develop a fever . Your discharge changes or worsens . You have persistent pain with intercourse . You develop shortness of breath, a rapid pulse, or you faint.  These symptoms could be signs of problems or infections that need to be evaluated  by a medical provider now.  MAKE SURE YOU    Understand these instructions.  Will watch your condition.  Will get help right away if you are not doing well or get worse.  Your e-visit answers were reviewed by a board certified advanced clinical practitioner to complete your personal care  plan. Depending upon the condition, your plan could have included both over the counter or prescription medications. Please review your pharmacy choice to make sure that you have choses a pharmacy that is open for you to pick up any needed prescription, Your safety is important to [place]. If you have drug allergies check your prescription carefully.   You can use MyChart to ask questions about today's visit, request a non-urgent call back, or ask for a work or school excuse for 24 hours related to this e-Visit. If it has been greater than 24 hours you will need to follow up with your provider, or enter a new e-Visit to address those concerns. You will get a MyChart message within the next two days asking about your experience. I hope that your e-visit has been valuable and will speed your recovery.

## 2018-02-07 ENCOUNTER — Telehealth: Payer: BC Managed Care – PPO | Admitting: Family

## 2018-02-07 ENCOUNTER — Ambulatory Visit: Payer: BC Managed Care – PPO | Admitting: Emergency Medicine

## 2018-02-07 ENCOUNTER — Encounter: Payer: Self-pay | Admitting: Emergency Medicine

## 2018-02-07 ENCOUNTER — Other Ambulatory Visit: Payer: Self-pay

## 2018-02-07 VITALS — BP 114/74 | HR 62 | Temp 98.8°F | Resp 16 | Ht 67.0 in | Wt 159.8 lb

## 2018-02-07 DIAGNOSIS — R509 Fever, unspecified: Secondary | ICD-10-CM | POA: Diagnosis not present

## 2018-02-07 DIAGNOSIS — B9789 Other viral agents as the cause of diseases classified elsewhere: Secondary | ICD-10-CM

## 2018-02-07 DIAGNOSIS — J329 Chronic sinusitis, unspecified: Secondary | ICD-10-CM | POA: Diagnosis not present

## 2018-02-07 DIAGNOSIS — R519 Headache, unspecified: Secondary | ICD-10-CM | POA: Insufficient documentation

## 2018-02-07 DIAGNOSIS — R0981 Nasal congestion: Secondary | ICD-10-CM | POA: Diagnosis not present

## 2018-02-07 DIAGNOSIS — R51 Headache: Secondary | ICD-10-CM

## 2018-02-07 DIAGNOSIS — J029 Acute pharyngitis, unspecified: Secondary | ICD-10-CM | POA: Diagnosis not present

## 2018-02-07 DIAGNOSIS — J0100 Acute maxillary sinusitis, unspecified: Secondary | ICD-10-CM | POA: Insufficient documentation

## 2018-02-07 LAB — POCT INFLUENZA A/B
Influenza A, POC: NEGATIVE
Influenza B, POC: NEGATIVE

## 2018-02-07 MED ORDER — TRIAMCINOLONE ACETONIDE 55 MCG/ACT NA AERO: 2.0000 | INHALATION_SPRAY | Freq: Every day | NASAL | 12 refills | Status: AC

## 2018-02-07 MED ORDER — PSEUDOEPHEDRINE-GUAIFENESIN ER 60-600 MG PO TB12: 1.0000 | ORAL_TABLET | Freq: Two times a day (BID) | ORAL | 1 refills | Status: AC

## 2018-02-07 MED ORDER — BENZONATATE 100 MG PO CAPS: 100.0000 mg | ORAL_CAPSULE | Freq: Three times a day (TID) | ORAL | 0 refills | Status: DC | PRN

## 2018-02-07 MED ORDER — FLUTICASONE PROPIONATE 50 MCG/ACT NA SUSP: 1.0000 | Freq: Two times a day (BID) | NASAL | 6 refills | Status: AC

## 2018-02-07 MED ORDER — AMOXICILLIN-POT CLAVULANATE 875-125 MG PO TABS: 1.0000 | ORAL_TABLET | Freq: Two times a day (BID) | ORAL | 0 refills | Status: AC

## 2018-02-07 NOTE — Patient Instructions (Addendum)
   If you have lab work done today you will be contacted with your lab results within the next 2 weeks.  If you have not heard from us then please contact us. The fastest way to get your results is to register for My Chart.   IF you received an x-ray today, you will receive an invoice from [facility]. Please contact [facility] at [phone] with questions or concerns regarding your invoice.   IF you received labwork today, you will receive an invoice from [facility]. Please contact [facility] at [phone] with questions or concerns regarding your invoice.   Our billing staff will not be able to assist you with questions regarding bills from these companies.  You will be contacted with the lab results as soon as they are available. The fastest way to get your results is to activate your My Chart account. Instructions are located on the last page of this paperwork. If you have not heard from us regarding the results in 2 weeks, please contact this office.     Sinusitis, Adult Sinusitis is soreness and swelling (inflammation) of your sinuses. Sinuses are hollow spaces in the bones around your face. They are located:  Around your eyes.  In the middle of your forehead.  Behind your nose.  In your cheekbones. Your sinuses and nasal passages are lined with a fluid called mucus. Mucus drains out of your sinuses. Swelling can trap mucus in your sinuses. This lets germs (bacteria, virus, or fungus) grow, which leads to infection. Most of the time, this condition is caused by a virus. What are the causes? This condition is caused by:  Allergies.  Asthma.  Germs.  Things that block your nose or sinuses.  Growths in the nose (nasal polyps).  Chemicals or irritants in the air.  Fungus (rare). What increases the risk? You are more likely to develop this condition if:  You have a weak body defense system (immune system).  You do a lot of swimming or  diving.  You use nasal sprays too much.  You smoke. What are the signs or symptoms? The main symptoms of this condition are pain and a feeling of pressure around the sinuses. Other symptoms include:  Stuffy nose (congestion).  Runny nose (drainage).  Swelling and warmth in the sinuses.  Headache.  Toothache.  A cough that may get worse at night.  Mucus that collects in the throat or the back of the nose (postnasal drip).  Being unable to smell and taste.  Being very tired (fatigue).  A fever.  Sore throat.  Bad breath. How is this diagnosed? This condition is diagnosed based on:  Your symptoms.  Your medical history.  A physical exam.  Tests to find out if your condition is short-term (acute) or long-term (chronic). Your doctor may: ? Check your nose for growths (polyps). ? Check your sinuses using a tool that has a light (endoscope). ? Check for allergies or germs. ? Do imaging tests, such as an MRI or CT scan. How is this treated? Treatment for this condition depends on the cause and whether it is short-term or long-term.  If caused by a virus, your symptoms should go away on their own within 10 days. You may be given medicines to relieve symptoms. They include: ? Medicines that shrink swollen tissue in the nose. ? Medicines that treat allergies (antihistamines). ? A spray that treats swelling of the nostrils. ? Rinses that help get rid of thick mucus in your   nose (nasal saline washes).  If caused by bacteria, your doctor may wait to see if you will get better without treatment. You may be given antibiotic medicine if you have: ? A very bad infection. ? A weak body defense system.  If caused by growths in the nose, you may need to have surgery. Follow these instructions at home: Medicines  Take, use, or apply over-the-counter and prescription medicines only as told by your doctor. These may include nasal sprays.  If you were prescribed an antibiotic  medicine, take it as told by your doctor. Do not stop taking the antibiotic even if you start to feel better. Hydrate and humidify   Drink enough water to keep your pee (urine) pale yellow.  Use a cool mist humidifier to keep the humidity level in your home above 50%.  Breathe in steam for 10-15 minutes, 3-4 times a day, or as told by your doctor. You can do this in the bathroom while a hot shower is running.  Try not to spend time in cool or dry air. Rest  Rest as much as you can.  Sleep with your head raised (elevated).  Make sure you get enough sleep each night. General instructions   Put a warm, moist washcloth on your face 3-4 times a day, or as often as told by your doctor. This will help with discomfort.  Wash your hands often with soap and water. If there is no soap and water, use hand sanitizer.  Do not smoke. Avoid being around people who are smoking (secondhand smoke).  Keep all follow-up visits as told by your doctor. This is important. Contact a doctor if:  You have a fever.  Your symptoms get worse.  Your symptoms do not get better within 10 days. Get help right away if:  You have a very bad headache.  You cannot stop throwing up (vomiting).  You have very bad pain or swelling around your face or eyes.  You have trouble seeing.  You feel confused.  Your neck is stiff.  You have trouble breathing. Summary  Sinusitis is swelling of your sinuses. Sinuses are hollow spaces in the bones around your face.  This condition is caused by tissues in your nose that become inflamed or swollen. This traps germs. These can lead to infection.  If you were prescribed an antibiotic medicine, take it as told by your doctor. Do not stop taking it even if you start to feel better.  Keep all follow-up visits as told by your doctor. This is important. This information is not intended to replace advice given to you by your health care provider. Make sure you discuss  any questions you have with your health care provider. Document Released: 07/04/2007 Document Revised: 06/17/2017 Document Reviewed: 06/17/2017 Elsevier Interactive Patient Education  2019 Elsevier Inc.  

## 2018-02-07 NOTE — Progress Notes (Signed)
Morgan Cruz 22 y.o.   Chief Complaint  Patient presents with  . Cough    productive with yellow mucus   . Fever    101.0 degrees x 2 days and fatigue    HISTORY OF PRESENT ILLNESS: This is a 23 y.o. female complaining of some started 3 to 4 days ago progressively getting worse.  Complaining of bilateral ear pain, sinus congestion and pressure, fever, and generalized achiness.  No other significant symptoms.  HPI   Prior to Admission medications   Medication Sig Start Date End Date Taking? Authorizing Provider  mupirocin ointment (BACTROBAN) 2 % Apply 1 application topically 2 (two) times daily. 10/31/16  Yes Yu, Amy V, PA-C  fluticasone (FLONASE) 50 MCG/ACT nasal spray Place 1 spray into both nostrils 2 (two) times daily. Patient not taking: Reported on 02/07/2018 02/07/18   Beau Fanny, FNP  Norethin Ace-Eth Estrad-FE (MIBELAS 24 FE) 1-20 MG-MCG(24) CHEW  08/08/15   [provider]    Allergies  Allergen Reactions  . Peanut Oil Itching  . Lac Bovis Rash    There are no active problems to display for this patient.   Past Medical History:  Diagnosis Date  . History of methicillin resistant staphylococcus aureus (MRSA)     No past surgical history on file.  Social History   Socioeconomic History  . Marital status: Single    Spouse name: Not on file  . Number of children: Not on file  . Years of education: Not on file  . Highest education level: Not on file  Occupational History  . Not on file  Social Needs  . Financial resource strain: Not on file  . Food insecurity:    Worry: Not on file    Inability: Not on file  . Transportation needs:    Medical: Not on file    Non-medical: Not on file  Tobacco Use  . Smoking status: Never Smoker  . Smokeless tobacco: Never Used  Substance and Sexual Activity  . Alcohol use: No  . Drug use: No  . Sexual activity: Never  Lifestyle  . Physical activity:    Days per week: Not on file    Minutes per  session: Not on file  . Stress: Not on file  Relationships  . Social connections:    Talks on phone: Not on file    Gets together: Not on file    Attends religious service: Not on file    Active member of club or organization: Not on file    Attends meetings of clubs or organizations: Not on file    Relationship status: Not on file  . Intimate partner violence:    Fear of current or ex partner: Not on file    Emotionally abused: Not on file    Physically abused: Not on file    Forced sexual activity: Not on file  Other Topics Concern  . Not on file  Social History Narrative  . Not on file    No family history on file.   Review of Systems  Constitutional: Positive for chills and fever.  HENT: Positive for congestion and sinus pain. Negative for sore throat.   Eyes: Negative.  Negative for discharge and redness.  Respiratory: Negative for cough, shortness of breath and wheezing.   Cardiovascular: Negative.  Negative for chest pain and palpitations.  Gastrointestinal: Negative.  Negative for abdominal pain, nausea and vomiting.  Musculoskeletal: Positive for myalgias.  Skin: Negative.  Negative for rash.  Neurological: Positive for headaches. Negative for dizziness.  Endo/Heme/Allergies: Negative.   All other systems reviewed and are negative.   Vitals:   02/07/18 1646  BP: 114/74  Pulse: 62  Resp: 16  Temp: 98.8 F (37.1 C)  SpO2: 100%    Physical Exam Vitals signs reviewed.  Constitutional:      Appearance: She is ill-appearing.  HENT:     Head: Normocephalic and atraumatic.     Right Ear: Tympanic membrane, ear canal and external ear normal.     Left Ear: Tympanic membrane, ear canal and external ear normal.     Nose:     Right Sinus: Maxillary sinus tenderness present.     Left Sinus: Maxillary sinus tenderness present.     Mouth/Throat:     Mouth: Mucous membranes are dry.     Pharynx: Oropharynx is clear.  Eyes:     Extraocular Movements: Extraocular  movements intact.     Conjunctiva/sclera: Conjunctivae normal.     Pupils: Pupils are equal, round, and reactive to light.  Neck:     Musculoskeletal: Normal range of motion.  Cardiovascular:     Rate and Rhythm: Normal rate and regular rhythm.     Heart sounds: Normal heart sounds.  Pulmonary:     Effort: Pulmonary effort is normal.     Breath sounds: Normal breath sounds.  Musculoskeletal: Normal range of motion.  Lymphadenopathy:     Cervical: No cervical adenopathy.  Skin:    General: Skin is warm and dry.     Capillary Refill: Capillary refill takes less than 2 seconds.  Neurological:     General: No focal deficit present.     Mental Status: She is alert and oriented to person, place, and time.  Psychiatric:        Mood and Affect: Mood normal.        Behavior: Behavior normal.     Results for orders placed or performed in visit on 02/07/18 (from the past 24 hour(s))  POCT Influenza A/B     Status: None   Collection Time: 02/07/18  5:13 PM  Result Value Ref Range   Influenza A, POC Negative Negative   Influenza B, POC Negative Negative   A total of 25 minutes was spent in the room with the patient, greater than 50% of which was in counseling/coordination of care regarding differential diagnosis, treatment, medications, and need for follow-up if no better or worse.  ASSESSMENT & PLAN: Morgan Cruz was seen today for cough and fever.  Diagnoses and all orders for this visit:  Fever, unspecified fever cause -     POCT Influenza A/B  Sinus congestion -     pseudoephedrine-guaifenesin (MUCINEX D) 60-600 MG 12 hr tablet; Take 1 tablet by mouth every 12 (twelve) hours for 5 days. -     triamcinolone (NASACORT) 55 MCG/ACT AERO nasal inhaler; Place 2 sprays into the nose daily.  Sinus headache  Acute non-recurrent maxillary sinusitis -     amoxicillin-clavulanate (AUGMENTIN) 875-125 MG tablet; Take 1 tablet by mouth 2 (two) times daily for 7 days.    Patient Instructions        If you have lab work done today you will be contacted with your lab results within the next 2 weeks.  If you have not heard from us then please contact us. The fastest way to get your results is to register for My Chart.   IF you received an x-ray today, you will receive an invoice  from [facility]. Please contact [facility] at [phone] with questions or concerns regarding your invoice.   IF you received labwork today, you will receive an invoice from [facility]. Please contact [facility] at [phone] with questions or concerns regarding your invoice.   Our billing staff will not be able to assist you with questions regarding bills from these companies.  You will be contacted with the lab results as soon as they are available. The fastest way to get your results is to activate your My Chart account. Instructions are located on the last page of this paperwork. If you have not heard from [place] regarding the results in 2 weeks, please contact this office.     Sinusitis, Adult Sinusitis is soreness and swelling (inflammation) of your sinuses. Sinuses are hollow spaces in the bones around your face. They are located:  Around your eyes.  In the middle of your forehead.  Behind your nose.  In your cheekbones. Your sinuses and nasal passages are lined with a fluid called mucus. Mucus drains out of your sinuses. Swelling can trap mucus in your sinuses. This lets germs (bacteria, virus, or fungus) grow, which leads to infection. Most of the time, this condition is caused by a virus. What are the causes? This condition is caused by:  Allergies.  Asthma.  Germs.  Things that block your nose or sinuses.  Growths in the nose (nasal polyps).  Chemicals or irritants in the air.  Fungus (rare). What increases the risk? You are more likely to develop this condition if:  You have a weak body defense system (immune system).  You do a lot of swimming or  diving.  You use nasal sprays too much.  You smoke. What are the signs or symptoms? The main symptoms of this condition are pain and a feeling of pressure around the sinuses. Other symptoms include:  Stuffy nose (congestion).  Runny nose (drainage).  Swelling and warmth in the sinuses.  Headache.  Toothache.  A cough that may get worse at night.  Mucus that collects in the throat or the back of the nose (postnasal drip).  Being unable to smell and taste.  Being very tired (fatigue).  A fever.  Sore throat.  Bad breath. How is this diagnosed? This condition is diagnosed based on:  Your symptoms.  Your medical history.  A physical exam.  Tests to find out if your condition is short-term (acute) or long-term (chronic). Your doctor may: ? Check your nose for growths (polyps). ? Check your sinuses using a tool that has a light (endoscope). ? Check for allergies or germs. ? Do imaging tests, such as an MRI or CT scan. How is this treated? Treatment for this condition depends on the cause and whether it is short-term or long-term.  If caused by a virus, your symptoms should go away on their own within 10 days. You may be given medicines to relieve symptoms. They include: ? Medicines that shrink swollen tissue in the nose. ? Medicines that treat allergies (antihistamines). ? A spray that treats swelling of the nostrils. ? Rinses that help get rid of thick mucus in your nose (nasal saline washes).  If caused by bacteria, your doctor may wait to see if you will get better without treatment. You may be given antibiotic medicine if you have: ? A very bad infection. ? A weak body defense system.  If caused by growths in the nose, you may need to have surgery. Follow these instructions at home:  Medicines  Take, use, or apply over-the-counter and prescription medicines only as told by your doctor. These may include nasal sprays.  If you were prescribed an antibiotic  medicine, take it as told by your doctor. Do not stop taking the antibiotic even if you start to feel better. Hydrate and humidify   Drink enough water to keep your pee (urine) pale yellow.  Use a cool mist humidifier to keep the humidity level in your home above 50%.  Breathe in steam for 10-15 minutes, 3-4 times a day, or as told by your doctor. You can do this in the bathroom while a hot shower is running.  Try not to spend time in cool or dry air. Rest  Rest as much as you can.  Sleep with your head raised (elevated).  Make sure you get enough sleep each night. General instructions   Put a warm, moist washcloth on your face 3-4 times a day, or as often as told by your doctor. This will help with discomfort.  Wash your hands often with soap and water. If there is no soap and water, use hand sanitizer.  Do not smoke. Avoid being around people who are smoking (secondhand smoke).  Keep all follow-up visits as told by your doctor. This is important. Contact a doctor if:  You have a fever.  Your symptoms get worse.  Your symptoms do not get better within 10 days. Get help right away if:  You have a very bad headache.  You cannot stop throwing up (vomiting).  You have very bad pain or swelling around your face or eyes.  You have trouble seeing.  You feel confused.  Your neck is stiff.  You have trouble breathing. Summary  Sinusitis is swelling of your sinuses. Sinuses are hollow spaces in the bones around your face.  This condition is caused by tissues in your nose that become inflamed or swollen. This traps germs. These can lead to infection.  If you were prescribed an antibiotic medicine, take it as told by your doctor. Do not stop taking it even if you start to feel better.  Keep all follow-up visits as told by your doctor. This is important. This information is not intended to replace advice given to you by your health care provider. Make sure you discuss  any questions you have with your health care provider. Document Released: 07/04/2007 Document Revised: 06/17/2017 Document Reviewed: 06/17/2017 Elsevier Interactive Patient Education  2019 Elsevier Inc.      Edwina Barth, MD [facility]

## 2018-02-07 NOTE — Progress Notes (Signed)
Thank you for the details you included in the comment boxes. Those details are very helpful in determining the best course of treatment for you and help us to provide the best care. .  Providers prescribe antibiotics to treat infections caused by bacteria. Antibiotics are very powerful in treating bacterial infections when they are used properly. To maintain their effectiveness, they should be used only when necessary. Overuse of antibiotics has resulted in the development of superbugs that are resistant to treatment.  After careful review of your answers, I would not recommend an antibiotic for your condition.  Antibiotics are not effective against viruses and therefore should not be used to treat them. Common examples of infections caused by viruses include colds and flu.  We are sorry that you are not feeling well.  Here is how we plan to help!  Based on what you have shared with me it looks like you have sinusitis.  Sinusitis is inflammation and infection in the sinus cavities of the head.  Based on your presentation I believe you most likely have Acute Viral Sinusitis.This is an infection most likely caused by a virus. There is not specific treatment for viral sinusitis other than to help you with the symptoms until the infection runs its course.  You may use an oral decongestant such as Mucinex D or if you have glaucoma or high blood pressure use plain Mucinex. Saline nasal spray help and can safely be used as often as needed for congestion, I have prescribed: Fluticasone nasal spray two sprays in each nostril once a day   I have also sent Tessalon Perles 100mg, take 1-2 every 8 hours as needed for cough.    Some authorities believe that zinc sprays or the use of Echinacea may shorten the course of your symptoms.  Sinus infections are not as easily transmitted as other respiratory infection, however we still recommend that you avoid close contact with loved ones, especially the very young and  elderly.  Remember to wash your hands thoroughly throughout the day as this is the number one way to prevent the spread of infection!  Home Care:  Only take medications as instructed by your medical team.  Do not take these medications with alcohol.  A steam or ultrasonic humidifier can help congestion.  You can place a towel over your head and breathe in the steam from hot water coming from a faucet.  Avoid close contacts especially the very young and the elderly.  Cover your mouth when you cough or sneeze.  Always remember to wash your hands.  Get Help Right Away If:  You develop worsening fever or sinus pain.  You develop a severe head ache or visual changes.  Your symptoms persist after you have completed your treatment plan.  Make sure you  Understand these instructions.  Will watch your condition.  Will get help right away if you are not doing well or get worse.  Your e-visit answers were reviewed by a board certified advanced clinical practitioner to complete your personal care plan.  Depending on the condition, your plan could have included both over the counter or prescription medications.  If there is a problem please reply  once you have received a response from your provider.  Your safety is important to us.  If you have drug allergies check your prescription carefully.    You can use MyChart to ask questions about today's visit, request a non-urgent call back, or ask for a work or school excuse   for 24 hours related to this e-Visit. If it has been greater than 24 hours you will need to follow up with your provider, or enter a new e-Visit to address those concerns.  You will get an e-mail in the next two days asking about your experience.  I hope that your e-visit has been valuable and will speed your recovery. Thank you for using e-visits.      

## 2018-07-21 ENCOUNTER — Emergency Department (HOSPITAL_COMMUNITY): Payer: BC Managed Care – PPO

## 2018-07-21 ENCOUNTER — Ambulatory Visit (INDEPENDENT_AMBULATORY_CARE_PROVIDER_SITE_OTHER)
Admission: RE | Admit: 2018-07-21 | Discharge: 2018-07-21 | Disposition: A | Discharge status: Home / Self Care | Payer: BC Managed Care – PPO | Source: Ambulatory Visit

## 2018-07-21 ENCOUNTER — Other Ambulatory Visit: Payer: Self-pay

## 2018-07-21 ENCOUNTER — Emergency Department (HOSPITAL_COMMUNITY)
Admission: EM | Admit: 2018-07-21 | Discharge: 2018-07-22 | Disposition: A | Discharge status: Home / Self Care | Payer: BC Managed Care – PPO | Attending: Emergency Medicine | Admitting: Emergency Medicine

## 2018-07-21 ENCOUNTER — Encounter (HOSPITAL_COMMUNITY): Payer: Self-pay | Admitting: Emergency Medicine

## 2018-07-21 DIAGNOSIS — Z91010 Allergy to peanuts: Secondary | ICD-10-CM | POA: Insufficient documentation

## 2018-07-21 DIAGNOSIS — J45901 Unspecified asthma with (acute) exacerbation: Secondary | ICD-10-CM | POA: Diagnosis not present

## 2018-07-21 DIAGNOSIS — R0602 Shortness of breath: Secondary | ICD-10-CM | POA: Diagnosis not present

## 2018-07-21 DIAGNOSIS — Z79899 Other long term (current) drug therapy: Secondary | ICD-10-CM | POA: Diagnosis not present

## 2018-07-21 MED ORDER — ALBUTEROL SULFATE HFA 108 (90 BASE) MCG/ACT IN AERS
2.0000 | INHALATION_SPRAY | Freq: Once | RESPIRATORY_TRACT | Status: AC
  Administered 2018-07-21: 2 via RESPIRATORY_TRACT
  Filled 2018-07-21: qty 6.7

## 2018-07-21 NOTE — ED Provider Notes (Signed)
Virtual Visit via Video Note:  Morgan Cruz  initiated request for Telemedicine visit with Morgan Cruz. I connected with Morgan Cruz  on 07/21/2018 at 6:10 PM  for a synchronized telemedicine visit using a video enabled HIPPA compliant telemedicine application. I verified that I am speaking with Morgan Cruz  using two identifiers. Morgan C Wieters, Morgan Cruz  was physically located in a [facility] Urgent care site and Morgan Cruz was located at a different location.   The limitations of evaluation and management by telemedicine as well as the availability of in-person appointments were discussed. Patient was informed that she  may incur a bill ( including co-pay) for this virtual visit encounter. Morgan Cruz  expressed understanding and gave verbal consent to proceed with virtual visit.     History of Present Illness:Morgan Cruz  is a 23 y.o. female presents with concern over shortness of breath.  Patient states that she recently traveled to [place] over the weekend.  She had a approximately 3-hour flight.  When she returned back to [place] she has felt significantly short of breath.  She feels short of breath with slight exertion and feels the need to sit down to catch her breath.  She notes some mild chest discomfort, but has also had some back discomfort.  She notes that she had a cough prior to traveling, but had attributed this to allergies.  Does not feel the shortness of breath is worsened over has been related to the cough.  Denies any fevers chills or body aches.  Denies known exposure to COVID.  Denies previous DVT/PE.  Is no longer on oral contraceptives.  Denies tobacco use.  Past Medical History:  Diagnosis Date  . History of methicillin resistant staphylococcus aureus (MRSA)     Allergies  Allergen Reactions  . Peanut Oil Itching  . Lac Bovis Rash        Observations/Objective: Physical Exam  Constitutional: She is oriented to person, place, and time. No  distress.  HENT:  Head: Normocephalic and atraumatic.  Neck: Normal range of motion. Neck supple.  Pulmonary/Chest: Effort normal. No respiratory distress.  Speaking in full sentences  Musculoskeletal:     Comments: Using extremities appropriately  Neurological: She is alert and oriented to person, place, and time.     Assessment and Plan: 23 year old female presenting for evaluation of shortness of breath over video.  Had trouble connecting audio with video, spoke with patient over the phone.  Given her recent travel and acute onset of shortness of breath following this, recommending further evaluation in emergency room to rule out PE.  Patient's main risk factor is travel, but seems less related to COVID as patient denied fever, body aches, chills and denies worsening cough.  Follow Up Instructions:    I discussed the assessment and treatment plan with the patient. The patient was provided an opportunity to ask questions and all were answered. The patient agreed with the plan and demonstrated an understanding of the instructions.   The patient was advised to call back or seek an in-person evaluation if the symptoms worsen or if the condition fails to improve as anticipated.      Janith Lima, Morgan Cruz  07/21/2018 6:10 PM         Janith Lima, Morgan Cruz 07/21/18 1910

## 2018-07-21 NOTE — Discharge Instructions (Signed)
Please go To emergency room to rule out blood clot as a cause of your symptoms

## 2018-07-21 NOTE — ED Triage Notes (Signed)
Pt reports cough and SOB 3 days after visiting [place]. Reports tightness in her back and feeling like shes having an asthma attack. Wheezing noted in triage.

## 2018-07-22 MED ORDER — AEROCHAMBER PLUS FLO-VU LARGE MISC
Status: AC
  Filled 2018-07-22: qty 1

## 2018-07-22 MED ORDER — AEROCHAMBER PLUS FLO-VU LARGE MISC: 1.0000 | Freq: Once | Status: DC

## 2018-07-22 MED ORDER — PREDNISONE 20 MG PO TABS: 40.0000 mg | ORAL_TABLET | Freq: Every day | ORAL | 0 refills | Status: DC

## 2018-07-22 MED ORDER — ALBUTEROL SULFATE HFA 108 (90 BASE) MCG/ACT IN AERS
8.0000 | INHALATION_SPRAY | Freq: Once | RESPIRATORY_TRACT | Status: AC
  Administered 2018-07-22: 01:00:00 8 via RESPIRATORY_TRACT
  Filled 2018-07-22: qty 6.7

## 2018-07-22 NOTE — ED Provider Notes (Signed)
[facility] Provider Note   CSN: [id number] Arrival date & time: 07/21/18  [id number]     History   Chief Complaint Chief Complaint  Patient presents with  . Shortness of Breath    HPI Morgan Cruz is a 23 y.o. female.     Patient presents to the emergency department with a chief complaint of shortness of breath and chest tightness.  She has a history of asthma.  She was seen via telehealth visit and was referred to the emergency department over concern for possible PE due to recent travel.  Patient denies any persistent chest pain, denies any lower extremity swelling, denies any history of PE or DVT.  She states that this feels similar to her previous asthma exacerbations.  She was given an albuterol inhaler treatment in triage with good relief of her symptoms.  The history is provided by the patient. No language interpreter was used.   Morgan Cruz was evaluated in Emergency Department on 07/22/2018 for the symptoms described in the history of present illness. She was evaluated in the context of the global COVID-19 pandemic, which necessitated consideration that the patient might be at risk for infection with the SARS-CoV-2 virus that causes COVID-19. Institutional protocols and algorithms that pertain to the evaluation of patients at risk for COVID-19 are in a state of rapid change based on information released by regulatory bodies including the CDC and federal and state organizations. These policies and algorithms were followed during the patient's care in the ED.  Past Medical History:  Diagnosis Date  . History of methicillin resistant staphylococcus aureus (MRSA)     Patient Active Problem List   Diagnosis Date Noted  . Fever 02/07/2018  . Sinus congestion 02/07/2018  . Sinus headache 02/07/2018  . Acute non-recurrent maxillary sinusitis 02/07/2018    History reviewed. No pertinent surgical history.   OB History   No obstetric history on  file.      Home Medications    Prior to Admission medications   Medication Sig Start Date End Date Taking? Authorizing Provider  fluticasone (FLONASE) 50 MCG/ACT nasal spray Place 1 spray into both nostrils 2 (two) times daily. Patient not taking: Reported on 02/07/2018 02/07/18   Withrow, Everardo AllJohn C, FNP  mupirocin ointment (BACTROBAN) 2 % Apply 1 application topically 2 (two) times daily. 10/31/16   Yu, Amy V, PA-C  Norethin Ace-Eth Estrad-FE (MIBELAS 24 FE) 1-20 MG-MCG(24) CHEW  08/08/15   [provider]  predniSONE (DELTASONE) 20 MG tablet Take 2 tablets (40 mg total) by mouth daily. 07/22/18   Roxy HorsemanBrowning, Hulan Szumski, PA-C  triamcinolone (NASACORT) 55 MCG/ACT AERO nasal inhaler Place 2 sprays into the nose daily. 02/07/18   Georgina QuintSagardia, Miguel Jose, MD    Family History No family history on file.  Social History Social History   Tobacco Use  . Smoking status: Never Smoker  . Smokeless tobacco: Never Used  Substance Use Topics  . Alcohol use: No  . Drug use: No     Allergies   Peanut oil and Lac bovis   Review of Systems Review of Systems  All other systems reviewed and are negative.    Physical Exam Updated Vital Signs BP (!) 140/100 (BP Location: Right Arm)   Pulse 71   Temp 98.8 F (37.1 C) (Oral)   Resp 18   Ht 5\' 7"  (1.702 m)   Wt 71.7 kg   SpO2 100%   BMI 24.75 kg/m   Physical Exam Vitals signs  and nursing note reviewed.  Constitutional:      General: She is not in acute distress.    Appearance: She is well-developed.  HENT:     Head: Normocephalic and atraumatic.  Eyes:     Conjunctiva/sclera: Conjunctivae normal.  Neck:     Musculoskeletal: Neck supple.  Cardiovascular:     Rate and Rhythm: Normal rate and regular rhythm.     Heart sounds: No murmur.  Pulmonary:     Effort: Pulmonary effort is normal. No respiratory distress.     Breath sounds: Wheezing present.     Comments: Mild and expiratory wheezes bilaterally Abdominal:     Palpations:  Abdomen is soft.     Tenderness: There is no abdominal tenderness.  Musculoskeletal:     Comments: No lower extremity swelling or tenderness  Skin:    General: Skin is warm and dry.  Neurological:     Mental Status: She is alert and oriented to person, place, and time.  Psychiatric:        Mood and Affect: Mood normal.        Behavior: Behavior normal.      ED Treatments / Results  Labs (all labs ordered are listed, but only abnormal results are displayed) Labs Reviewed - No data to display  EKG EKG Interpretation  Date/Time:  Monday July 21 2018 18:55:53 EDT Ventricular Rate:  73 PR Interval:  190 QRS Duration: 86 QT Interval:  380 QTC Calculation: 418 R Axis:   73 Text Interpretation:  Sinus rhythm with marked sinus arrhythmia Confirmed by Dory Horn) on 07/22/2018 12:05:10 AM   Radiology Dg Chest 2 View  Result Date: 07/21/2018 CLINICAL DATA:  Cough and shortness of breath. EXAM: CHEST - 2 VIEW COMPARISON:  None. FINDINGS: The cardiomediastinal contours are normal. Mild central bronchial thickening. Pulmonary vasculature is normal. No consolidation, pleural effusion, or pneumothorax. No acute osseous abnormalities are seen. IMPRESSION: Mild central bronchial thickening suggesting bronchitis or asthma. Electronically Signed   By: Keith Rake M.D.   On: 07/21/2018 19:36    Procedures Procedures (including critical care time)  Medications Ordered in ED Medications  albuterol (VENTOLIN HFA) 108 (90 Base) MCG/ACT inhaler 8 puff (has no administration in time range)  AeroChamber Plus Flo-Vu Large MISC 1 each (has no administration in time range)  albuterol (VENTOLIN HFA) 108 (90 Base) MCG/ACT inhaler 2 puff (2 puffs Inhalation Given 07/21/18 1903)     Initial Impression / Assessment and Plan / ED Course  I have reviewed the triage vital signs and the nursing notes.  Pertinent labs & imaging results that were available during my care of the patient were  reviewed by me and considered in my medical decision making (see chart for details).        Patient with shortness of breath and chest tightness.  Chest x-ray suggestive of bronchitis or asthma.  Symptoms seem consistent with asthma, and feel similar to patient's prior asthma exacerbations.  She does have mild wheezing.  She reports significant improvement with albuterol.  I doubt PE or DVT.  Doubt coronavirus.  Will discharge home with prednisone and inhaler.  Final Clinical Impressions(s) / ED Diagnoses   Final diagnoses:  Mild asthma with exacerbation, unspecified whether persistent    ED Discharge Orders         Ordered    predniSONE (DELTASONE) 20 MG tablet  Daily     07/22/18 0038           Montine Circle,  PA-C 07/22/18 0048    Palumbo, April, MD 07/22/18 [id number]

## 2018-12-23 ENCOUNTER — Other Ambulatory Visit: Payer: Self-pay

## 2018-12-23 ENCOUNTER — Emergency Department (HOSPITAL_BASED_OUTPATIENT_CLINIC_OR_DEPARTMENT_OTHER)
Admission: EM | Admit: 2018-12-23 | Discharge: 2018-12-23 | Disposition: A | Discharge status: Home / Self Care | Payer: BC Managed Care – PPO | Attending: Emergency Medicine | Admitting: Emergency Medicine

## 2018-12-23 ENCOUNTER — Encounter (HOSPITAL_BASED_OUTPATIENT_CLINIC_OR_DEPARTMENT_OTHER): Payer: Self-pay

## 2018-12-23 ENCOUNTER — Emergency Department (HOSPITAL_BASED_OUTPATIENT_CLINIC_OR_DEPARTMENT_OTHER): Payer: BC Managed Care – PPO

## 2018-12-23 DIAGNOSIS — Y92410 Unspecified street and highway as the place of occurrence of the external cause: Secondary | ICD-10-CM | POA: Diagnosis not present

## 2018-12-23 DIAGNOSIS — Z79899 Other long term (current) drug therapy: Secondary | ICD-10-CM | POA: Diagnosis not present

## 2018-12-23 DIAGNOSIS — Y9389 Activity, other specified: Secondary | ICD-10-CM | POA: Diagnosis not present

## 2018-12-23 DIAGNOSIS — M25562 Pain in left knee: Secondary | ICD-10-CM | POA: Diagnosis not present

## 2018-12-23 DIAGNOSIS — M791 Myalgia, unspecified site: Secondary | ICD-10-CM

## 2018-12-23 DIAGNOSIS — S0990XA Unspecified injury of head, initial encounter: Secondary | ICD-10-CM | POA: Diagnosis present

## 2018-12-23 DIAGNOSIS — S0083XA Contusion of other part of head, initial encounter: Secondary | ICD-10-CM | POA: Insufficient documentation

## 2018-12-23 DIAGNOSIS — Z91010 Allergy to peanuts: Secondary | ICD-10-CM | POA: Diagnosis not present

## 2018-12-23 DIAGNOSIS — Y999 Unspecified external cause status: Secondary | ICD-10-CM | POA: Diagnosis not present

## 2018-12-23 MED ORDER — IBUPROFEN 600 MG PO TABS: 600.0000 mg | ORAL_TABLET | Freq: Four times a day (QID) | ORAL | 0 refills | Status: AC | PRN

## 2018-12-23 MED FILL — IBUPROFEN 600 MG TABLET: 600 | 7 days supply | Qty: 30 | Fill #0
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
## 2018-12-23 NOTE — ED Notes (Signed)
Patient transported to CT 

## 2018-12-23 NOTE — Discharge Instructions (Signed)
Swelling on your forehead should go down over the next several days.  You can apply ice packs as needed at home.  You can take Tylenol as needed for pain.  Your CT scan did not show any signs of a brain bleed or skull fracture.  However, it is still possible that you may develop signs or symptoms of a mild concussion.  This includes difficulty sleeping, difficulty concentrating, mild headaches, and difficulty focusing.  The symptoms generally resolve within 1 month.  It is important you follow-up with your primary care provider in 1 week to recheck your symptoms.  Your provider may wish to discuss her case over the phone or have you seen in the office.
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
## 2019-01-27 ENCOUNTER — Ambulatory Visit: Payer: BC Managed Care – PPO | Admitting: Adult Health Nurse Practitioner

## 2019-01-28 ENCOUNTER — Encounter: Payer: Self-pay | Admitting: Adult Health Nurse Practitioner

## 2019-04-16 ENCOUNTER — Telehealth: Payer: BC Managed Care – PPO | Admitting: Nurse Practitioner

## 2019-04-16 DIAGNOSIS — J4521 Mild intermittent asthma with (acute) exacerbation: Secondary | ICD-10-CM | POA: Diagnosis not present

## 2019-04-16 MED ORDER — PREDNISONE 20 MG PO TABS: ORAL_TABLET | ORAL | 0 refills | Status: AC

## 2019-04-16 MED ORDER — ALBUTEROL SULFATE HFA 108 (90 BASE) MCG/ACT IN AERS: 2.0000 | INHALATION_SPRAY | Freq: Four times a day (QID) | RESPIRATORY_TRACT | 0 refills | Status: DC | PRN

## 2019-04-16 NOTE — Progress Notes (Signed)
Visit for Asthma  Based on what you have shared with me, it looks like you may have a flare up of your asthma.  Asthma is a chronic (ongoing) lung disease which results in airway obstruction, inflammation and hyper-responsiveness.   Asthma symptoms vary from person to person, with common symptoms including nighttime awakening and decreased ability to participate in normal activities as a result of shortness of breath. It is often triggered by changes in weather, changes in the season, changes in air temperature, or inside (home, school, daycare or work) allergens such as animal dander, mold, mildew, woodstoves or cockroaches.   It can also be triggered by hormonal changes, extreme emotion, physical exertion or an upper respiratory tract illness.     It is important to identify the trigger, and then eliminate or avoid the trigger if possible.   If you have been prescribed medications to be taken on a regular basis, it is important to follow the asthma action plan and to follow guidelines to adjust medication in response to increasing symptoms of decreased peak expiratory flow rate  Treatment: I have prescribed: Prednisone 40mg by mouth per day for 5 - 7 days  HOME CARE Only take medications as instructed by your medical team. Consider wearing a mask or scarf to improve breathing air temperature have been shown to decrease irritation and decrease exacerbations Get rest. Taking a steamy shower or using a humidifier may help nasal congestion sand ease sore throat pain. You can place a towel over your head and breathe in the steam from hot water coming from a faucet. Using a saline nasal spray works much the same way.  Cough drops, hare candies and sore throat lozenges may ease your cough.  Avoid close contacts especially the very you and the elderly Cover your mouth if you cough or  sneeze Always remember to wash your hands.    GET HELP RIGHT AWAY IF: You develop worsening symptoms; breathlessness at rest, drowsy, confused or agitated, unable to speak in full sentences You have coughing fits You develop a severe headache or visual changes You develop shortness of breath, difficulty breathing or start having chest pain Your symptoms persist after you have completed your treatment plan If your symptoms do not improve within 10 days  MAKE SURE YOU Understand these instructions. Will watch your condition. Will get help right away if you are not doing well or get worse.   Your e-visit answers were reviewed by a board certified advanced clinical practitioner to complete your personal care plan, Depending upon the condition, your plan could have included both over the counter or prescription medications.   Please review your pharmacy choice. Your safety is important to us. If you have drug allergies check your prescription carefully.  You can use MyChart to ask questions about today's visit, request a non-urgent  call back, or ask for a work or school excuse for 24 hours related to this e-Visit. If it has been greater than 24 hours you will need to follow up with your provider, or enter a new e-Visit to address those concerns.   You will get an e-mail in the next two days asking about your experience. I hope that your e-visit has been valuable and will speed your recovery. Thank you for using e-visits.  5-10 minutes spent reviewing and documenting in chart.   

## 2019-04-16 NOTE — Addendum Note (Signed)
Addended by: Bennie Pierini on: 04/16/2019 12:33 PM   Modules accepted: Orders

## 2019-06-18 ENCOUNTER — Telehealth: Payer: BC Managed Care – PPO | Admitting: Emergency Medicine

## 2019-06-18 DIAGNOSIS — J329 Chronic sinusitis, unspecified: Secondary | ICD-10-CM | POA: Diagnosis not present

## 2019-06-18 MED ORDER — DOXYCYCLINE HYCLATE 100 MG PO CAPS: 100.0000 mg | ORAL_CAPSULE | Freq: Two times a day (BID) | ORAL | 0 refills | Status: AC

## 2019-06-18 NOTE — Progress Notes (Signed)
We are sorry that you are not feeling well.  Here is how we plan to help!  Based on what you have shared with me it looks like you have sinusitis.  Sinusitis is inflammation and infection in the sinus cavities of the head.  Based on your presentation I believe you most likely have Acute Bacterial Sinusitis.  This is an infection caused by bacteria and is treated with antibiotics. I have prescribed Doxycycline 100mg by mouth twice a day for 10 days. You may use an oral decongestant such as Mucinex D or if you have glaucoma or high blood pressure use plain Mucinex. Saline nasal spray help and can safely be used as often as needed for congestion.  If you develop worsening sinus pain, fever or notice severe headache and vision changes, or if symptoms are not better after completion of antibiotic, please schedule an appointment with a health care provider.    Sinus infections are not as easily transmitted as other respiratory infection, however we still recommend that you avoid close contact with loved ones, especially the very young and elderly.  Remember to wash your hands thoroughly throughout the day as this is the number one way to prevent the spread of infection!  Home Care:  Only take medications as instructed by your medical team.  Complete the entire course of an antibiotic.  Do not take these medications with alcohol.  A steam or ultrasonic humidifier can help congestion.  You can place a towel over your head and breathe in the steam from hot water coming from a faucet.  Avoid close contacts especially the very young and the elderly.  Cover your mouth when you cough or sneeze.  Always remember to wash your hands.  Get Help Right Away If:  You develop worsening fever or sinus pain.  You develop a severe head ache or visual changes.  Your symptoms persist after you have completed your treatment plan.  Make sure you  Understand these instructions.  Will watch your  condition.  Will get help right away if you are not doing well or get worse.  Your e-visit answers were reviewed by a board certified advanced clinical practitioner to complete your personal care plan.  Depending on the condition, your plan could have included both over the counter or prescription medications.  If there is a problem please reply  once you have received a response from your provider.  Your safety is important to us.  If you have drug allergies check your prescription carefully.    You can use MyChart to ask questions about today's visit, request a non-urgent call back, or ask for a work or school excuse for 24 hours related to this e-Visit. If it has been greater than 24 hours you will need to follow up with your provider, or enter a new e-Visit to address those concerns.  You will get an e-mail in the next two days asking about your experience.  I hope that your e-visit has been valuable and will speed your recovery. Thank you for using e-visits.   Approximately 5 minutes was used in reviewing the patient's chart, questionnaire, prescribing medications, and documentation.  

## 2019-07-13 ENCOUNTER — Telehealth: Payer: BC Managed Care – PPO | Admitting: Emergency Medicine

## 2019-07-13 DIAGNOSIS — J45909 Unspecified asthma, uncomplicated: Secondary | ICD-10-CM | POA: Diagnosis not present

## 2019-07-13 MED ORDER — ALBUTEROL SULFATE HFA 108 (90 BASE) MCG/ACT IN AERS: 2.0000 | INHALATION_SPRAY | RESPIRATORY_TRACT | 0 refills | Status: AC | PRN

## 2019-07-13 NOTE — Progress Notes (Signed)
Visit for Asthma  Based on what you have shared with me, it looks like you may have a flare up of your asthma.  Asthma is a chronic (ongoing) lung disease which results in airway obstruction, inflammation and hyper-responsiveness.   Asthma symptoms vary from person to person, with common symptoms including nighttime awakening and decreased ability to participate in normal activities as a result of shortness of breath. It is often triggered by changes in weather, changes in the season, changes in air temperature, or inside (home, school, daycare or work) allergens such as animal dander, mold, mildew, woodstoves or cockroaches.   It can also be triggered by hormonal changes, extreme emotion, physical exertion or an upper respiratory tract illness.     It is important to identify the trigger, and then eliminate or avoid the trigger if possible.   If you have been prescribed medications to be taken on a regular basis, it is important to follow the asthma action plan and to follow guidelines to adjust medication in response to increasing symptoms of decreased peak expiratory flow rate  Treatment: I have prescribed: Albuterol (Proventil HFA; Ventolin HFA) 108 (90 Base) MCG/ACT Inhaler 2 puffs into the lungs every six hours as needed for wheezing or shortness of breath  HOME CARE . Only take medications as instructed by your medical team. . Consider wearing a mask or scarf to improve breathing air temperature have been shown to decrease irritation and decrease exacerbations . Get rest. . Taking a steamy shower or using a humidifier may help nasal congestion sand ease sore throat pain. You can place a towel over your head and breathe in the steam from hot water coming from a faucet. . Using a saline nasal spray works much the same way.  . Cough drops, hare candies and sore throat lozenges may  ease your cough.  . Avoid close contacts especially the very you and the elderly . Cover your mouth if you cough or sneeze . Always remember to wash your hands.    GET HELP RIGHT AWAY IF: . You develop worsening symptoms; breathlessness at rest, drowsy, confused or agitated, unable to speak in full sentences . You have coughing fits . You develop a severe headache or visual changes . You develop shortness of breath, difficulty breathing or start having chest pain . Your symptoms persist after you have completed your treatment plan . If your symptoms do not improve within 10 days  MAKE SURE YOU . Understand these instructions. . Will watch your condition. . Will get help right away if you are not doing well or get worse.   Your e-visit answers were reviewed by a board certified advanced clinical practitioner to complete your personal care plan, Depending upon the condition, your plan could have included both over the counter or prescription medications.  Please review your pharmacy choice. Your safety is important to us. If you have drug allergies check your prescription carefully. You can use MyChart to ask questions about today's visit, request a non-urgent call back, or ask for a work or school excuse for 24 hours related to this e-Visit. If it has been greater than 24 hours you will need to follow up with your provider, or enter a new e-Visit to address those concerns.  You will get an e-mail in the next two days asking about your experience. I hope that your e-visit has been valuable and will speed your recovery. Thank you for using e-visits.   Approximately 5 minutes was used   in reviewing the patient's chart, questionnaire, prescribing medications, and documentation.  

## 2019-11-02 ENCOUNTER — Telehealth: Payer: BC Managed Care – PPO | Admitting: Physician Assistant

## 2019-11-02 DIAGNOSIS — M545 Low back pain, unspecified: Secondary | ICD-10-CM | POA: Diagnosis not present

## 2019-11-02 MED ORDER — CYCLOBENZAPRINE HCL 10 MG PO TABS: 10.0000 mg | ORAL_TABLET | Freq: Three times a day (TID) | ORAL | 0 refills | Status: AC | PRN

## 2019-11-02 MED ORDER — NAPROXEN 500 MG PO TABS: 500.0000 mg | ORAL_TABLET | Freq: Two times a day (BID) | ORAL | 0 refills | Status: AC

## 2019-11-02 NOTE — Progress Notes (Signed)
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
## 2020-09-01 DIAGNOSIS — Z113 Encounter for screening for infections with a predominantly sexual mode of transmission: Secondary | ICD-10-CM | POA: Diagnosis not present

## 2020-09-01 DIAGNOSIS — Z124 Encounter for screening for malignant neoplasm of cervix: Secondary | ICD-10-CM | POA: Diagnosis not present

## 2020-09-01 DIAGNOSIS — Z3400 Encounter for supervision of normal first pregnancy, unspecified trimester: Secondary | ICD-10-CM | POA: Diagnosis not present

## 2020-09-01 DIAGNOSIS — Z3682 Encounter for antenatal screening for nuchal translucency: Secondary | ICD-10-CM | POA: Diagnosis not present

## 2020-10-20 DIAGNOSIS — Z3400 Encounter for supervision of normal first pregnancy, unspecified trimester: Secondary | ICD-10-CM | POA: Diagnosis not present

## 2020-11-17 DIAGNOSIS — Z23 Encounter for immunization: Secondary | ICD-10-CM | POA: Diagnosis not present

## 2020-12-15 DIAGNOSIS — Z23 Encounter for immunization: Secondary | ICD-10-CM | POA: Diagnosis not present

## 2020-12-15 DIAGNOSIS — Z3402 Encounter for supervision of normal first pregnancy, second trimester: Secondary | ICD-10-CM | POA: Diagnosis not present

## 2021-01-17 DIAGNOSIS — J040 Acute laryngitis: Secondary | ICD-10-CM | POA: Diagnosis not present

## 2021-01-17 DIAGNOSIS — Z331 Pregnant state, incidental: Secondary | ICD-10-CM | POA: Diagnosis not present

## 2021-01-17 DIAGNOSIS — R0981 Nasal congestion: Secondary | ICD-10-CM | POA: Diagnosis not present

## 2021-01-17 DIAGNOSIS — Z20822 Contact with and (suspected) exposure to covid-19: Secondary | ICD-10-CM | POA: Diagnosis not present

## 2021-01-28 DIAGNOSIS — O4693 Antepartum hemorrhage, unspecified, third trimester: Secondary | ICD-10-CM | POA: Diagnosis not present

## 2021-02-20 DIAGNOSIS — O133 Gestational [pregnancy-induced] hypertension without significant proteinuria, third trimester: Secondary | ICD-10-CM | POA: Diagnosis not present

## 2021-03-04 DIAGNOSIS — O99214 Obesity complicating childbirth: Secondary | ICD-10-CM | POA: Diagnosis not present

## 2021-03-04 DIAGNOSIS — O9952 Diseases of the respiratory system complicating childbirth: Secondary | ICD-10-CM | POA: Diagnosis not present

## 2021-03-04 DIAGNOSIS — O99824 Streptococcus B carrier state complicating childbirth: Secondary | ICD-10-CM | POA: Diagnosis not present

## 2021-03-04 DIAGNOSIS — Z3A38 38 weeks gestation of pregnancy: Secondary | ICD-10-CM | POA: Diagnosis not present

## 2021-03-04 DIAGNOSIS — J45909 Unspecified asthma, uncomplicated: Secondary | ICD-10-CM | POA: Diagnosis not present

## 2021-03-04 DIAGNOSIS — Z3403 Encounter for supervision of normal first pregnancy, third trimester: Secondary | ICD-10-CM | POA: Diagnosis not present

## 2021-04-13 DIAGNOSIS — Z30430 Encounter for insertion of intrauterine contraceptive device: Secondary | ICD-10-CM | POA: Diagnosis not present

## 2021-05-18 DIAGNOSIS — Z975 Presence of (intrauterine) contraceptive device: Secondary | ICD-10-CM | POA: Diagnosis not present
